# Patient Record
Sex: Female | Born: 1955
Health system: Southern US, Community
[De-identification: ages and names within clinical notes are randomized; demographics above are authoritative.]

## PROBLEM LIST (undated history)

## (undated) DIAGNOSIS — F419 Anxiety disorder, unspecified: Secondary | ICD-10-CM

## (undated) DIAGNOSIS — K5909 Other constipation: Secondary | ICD-10-CM

## (undated) DIAGNOSIS — F329 Major depressive disorder, single episode, unspecified: Secondary | ICD-10-CM

## (undated) DIAGNOSIS — K589 Irritable bowel syndrome without diarrhea: Secondary | ICD-10-CM

## (undated) DIAGNOSIS — I10 Essential (primary) hypertension: Secondary | ICD-10-CM

## (undated) DIAGNOSIS — M1990 Unspecified osteoarthritis, unspecified site: Secondary | ICD-10-CM

## (undated) DIAGNOSIS — R51 Headache: Secondary | ICD-10-CM

## (undated) DIAGNOSIS — M8580 Other specified disorders of bone density and structure, unspecified site: Secondary | ICD-10-CM

## (undated) DIAGNOSIS — F32A Depression, unspecified: Secondary | ICD-10-CM

## (undated) DIAGNOSIS — K219 Gastro-esophageal reflux disease without esophagitis: Secondary | ICD-10-CM

## (undated) DIAGNOSIS — Z87442 Personal history of urinary calculi: Secondary | ICD-10-CM

## (undated) DIAGNOSIS — R519 Headache, unspecified: Secondary | ICD-10-CM

## (undated) DIAGNOSIS — S52501A Unspecified fracture of the lower end of right radius, initial encounter for closed fracture: Secondary | ICD-10-CM

## (undated) HISTORY — DX: Depression, unspecified: F32.A

## (undated) HISTORY — DX: Other specified disorders of bone density and structure, unspecified site: M85.80

## (undated) HISTORY — DX: Unspecified osteoarthritis, unspecified site: M19.90

## (undated) HISTORY — DX: Other constipation: K59.09

## (undated) HISTORY — PX: WRIST FRACTURE SURGERY: SHX121

## (undated) HISTORY — PX: CHOLECYSTECTOMY: SHX55

## (undated) HISTORY — PX: OTHER SURGICAL HISTORY: SHX169

---
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
## 2001-01-31 HISTORY — PX: WRIST FRACTURE SURGERY: SHX121

## 2001-06-03 ENCOUNTER — Other Ambulatory Visit: Admission: RE | Admit: 2001-06-03 | Discharge: 2001-06-03 | Payer: Self-pay | Admitting: Obstetrics and Gynecology

## 2001-10-28 ENCOUNTER — Ambulatory Visit (HOSPITAL_BASED_OUTPATIENT_CLINIC_OR_DEPARTMENT_OTHER): Admission: RE | Admit: 2001-10-28 | Discharge: 2001-10-28 | Payer: Self-pay | Admitting: Urology

## 2001-12-30 ENCOUNTER — Encounter: Payer: Self-pay | Admitting: Internal Medicine

## 2001-12-30 ENCOUNTER — Ambulatory Visit (HOSPITAL_COMMUNITY): Admission: RE | Admit: 2001-12-30 | Discharge: 2001-12-30 | Payer: Self-pay | Admitting: Internal Medicine

## 2002-10-27 ENCOUNTER — Other Ambulatory Visit: Admission: RE | Admit: 2002-10-27 | Discharge: 2002-10-27 | Payer: Self-pay | Admitting: Obstetrics and Gynecology

## 2002-12-22 ENCOUNTER — Ambulatory Visit (HOSPITAL_COMMUNITY): Admission: RE | Admit: 2002-12-22 | Discharge: 2002-12-22 | Payer: Self-pay | Admitting: Cardiology

## 2002-12-22 ENCOUNTER — Encounter: Payer: Self-pay | Admitting: Internal Medicine

## 2003-01-12 ENCOUNTER — Ambulatory Visit (HOSPITAL_BASED_OUTPATIENT_CLINIC_OR_DEPARTMENT_OTHER): Admission: RE | Admit: 2003-01-12 | Discharge: 2003-01-12 | Payer: Self-pay | Admitting: Urology

## 2003-11-16 ENCOUNTER — Other Ambulatory Visit: Admission: RE | Admit: 2003-11-16 | Discharge: 2003-11-16 | Payer: Self-pay | Admitting: Obstetrics and Gynecology

## 2004-11-21 ENCOUNTER — Other Ambulatory Visit: Admission: RE | Admit: 2004-11-21 | Discharge: 2004-11-21 | Payer: Self-pay | Admitting: Obstetrics and Gynecology

## 2011-11-12 ENCOUNTER — Encounter: Payer: Self-pay | Admitting: Internal Medicine

## 2012-11-09 ENCOUNTER — Encounter: Payer: Self-pay | Admitting: Internal Medicine

## 2012-11-10 ENCOUNTER — Encounter: Payer: Self-pay | Admitting: Internal Medicine

## 2015-05-03 ENCOUNTER — Ambulatory Visit (INDEPENDENT_AMBULATORY_CARE_PROVIDER_SITE_OTHER): Payer: BLUE CROSS/BLUE SHIELD | Admitting: *Deleted

## 2015-05-03 DIAGNOSIS — J3089 Other allergic rhinitis: Secondary | ICD-10-CM

## 2015-05-10 ENCOUNTER — Ambulatory Visit (INDEPENDENT_AMBULATORY_CARE_PROVIDER_SITE_OTHER): Payer: BLUE CROSS/BLUE SHIELD | Admitting: *Deleted

## 2015-05-10 DIAGNOSIS — J309 Allergic rhinitis, unspecified: Secondary | ICD-10-CM

## 2015-05-24 ENCOUNTER — Ambulatory Visit (INDEPENDENT_AMBULATORY_CARE_PROVIDER_SITE_OTHER): Payer: BLUE CROSS/BLUE SHIELD | Admitting: *Deleted

## 2015-05-24 DIAGNOSIS — J309 Allergic rhinitis, unspecified: Secondary | ICD-10-CM | POA: Diagnosis not present

## 2015-06-06 DIAGNOSIS — J3089 Other allergic rhinitis: Secondary | ICD-10-CM | POA: Diagnosis not present

## 2015-06-07 ENCOUNTER — Ambulatory Visit (INDEPENDENT_AMBULATORY_CARE_PROVIDER_SITE_OTHER): Payer: BLUE CROSS/BLUE SHIELD | Admitting: *Deleted

## 2015-06-07 DIAGNOSIS — J309 Allergic rhinitis, unspecified: Secondary | ICD-10-CM | POA: Diagnosis not present

## 2015-06-07 DIAGNOSIS — J302 Other seasonal allergic rhinitis: Secondary | ICD-10-CM | POA: Diagnosis not present

## 2015-06-21 ENCOUNTER — Ambulatory Visit (INDEPENDENT_AMBULATORY_CARE_PROVIDER_SITE_OTHER): Payer: BLUE CROSS/BLUE SHIELD | Admitting: *Deleted

## 2015-06-21 DIAGNOSIS — J309 Allergic rhinitis, unspecified: Secondary | ICD-10-CM | POA: Diagnosis not present

## 2015-07-05 ENCOUNTER — Ambulatory Visit (INDEPENDENT_AMBULATORY_CARE_PROVIDER_SITE_OTHER): Payer: BLUE CROSS/BLUE SHIELD | Admitting: *Deleted

## 2015-07-05 DIAGNOSIS — J309 Allergic rhinitis, unspecified: Secondary | ICD-10-CM | POA: Diagnosis not present

## 2015-07-19 ENCOUNTER — Ambulatory Visit (INDEPENDENT_AMBULATORY_CARE_PROVIDER_SITE_OTHER): Payer: BLUE CROSS/BLUE SHIELD | Admitting: *Deleted

## 2015-07-19 DIAGNOSIS — J309 Allergic rhinitis, unspecified: Secondary | ICD-10-CM | POA: Diagnosis not present

## 2015-08-01 ENCOUNTER — Ambulatory Visit (INDEPENDENT_AMBULATORY_CARE_PROVIDER_SITE_OTHER): Payer: BLUE CROSS/BLUE SHIELD | Admitting: *Deleted

## 2015-08-01 DIAGNOSIS — J309 Allergic rhinitis, unspecified: Secondary | ICD-10-CM | POA: Diagnosis not present

## 2015-08-16 ENCOUNTER — Ambulatory Visit (INDEPENDENT_AMBULATORY_CARE_PROVIDER_SITE_OTHER): Payer: PRIVATE HEALTH INSURANCE | Admitting: *Deleted

## 2015-08-16 DIAGNOSIS — J309 Allergic rhinitis, unspecified: Secondary | ICD-10-CM | POA: Diagnosis not present

## 2015-08-30 ENCOUNTER — Ambulatory Visit (INDEPENDENT_AMBULATORY_CARE_PROVIDER_SITE_OTHER): Payer: PRIVATE HEALTH INSURANCE | Admitting: *Deleted

## 2015-08-30 DIAGNOSIS — J309 Allergic rhinitis, unspecified: Secondary | ICD-10-CM

## 2015-09-13 ENCOUNTER — Ambulatory Visit (INDEPENDENT_AMBULATORY_CARE_PROVIDER_SITE_OTHER): Payer: PRIVATE HEALTH INSURANCE

## 2015-09-13 DIAGNOSIS — J309 Allergic rhinitis, unspecified: Secondary | ICD-10-CM

## 2015-10-04 ENCOUNTER — Ambulatory Visit (INDEPENDENT_AMBULATORY_CARE_PROVIDER_SITE_OTHER): Payer: PRIVATE HEALTH INSURANCE | Admitting: *Deleted

## 2015-10-04 DIAGNOSIS — J309 Allergic rhinitis, unspecified: Secondary | ICD-10-CM

## 2015-10-11 ENCOUNTER — Ambulatory Visit (INDEPENDENT_AMBULATORY_CARE_PROVIDER_SITE_OTHER): Payer: PRIVATE HEALTH INSURANCE | Admitting: *Deleted

## 2015-10-11 DIAGNOSIS — J309 Allergic rhinitis, unspecified: Secondary | ICD-10-CM | POA: Diagnosis not present

## 2015-10-11 DIAGNOSIS — J3089 Other allergic rhinitis: Secondary | ICD-10-CM

## 2015-10-25 ENCOUNTER — Ambulatory Visit (INDEPENDENT_AMBULATORY_CARE_PROVIDER_SITE_OTHER): Payer: PRIVATE HEALTH INSURANCE | Admitting: *Deleted

## 2015-10-25 DIAGNOSIS — J309 Allergic rhinitis, unspecified: Secondary | ICD-10-CM

## 2015-11-18 ENCOUNTER — Ambulatory Visit (INDEPENDENT_AMBULATORY_CARE_PROVIDER_SITE_OTHER): Payer: PRIVATE HEALTH INSURANCE | Admitting: *Deleted

## 2015-11-18 DIAGNOSIS — J309 Allergic rhinitis, unspecified: Secondary | ICD-10-CM | POA: Diagnosis not present

## 2015-11-19 DIAGNOSIS — J3089 Other allergic rhinitis: Secondary | ICD-10-CM | POA: Diagnosis not present

## 2015-11-20 DIAGNOSIS — J302 Other seasonal allergic rhinitis: Secondary | ICD-10-CM | POA: Diagnosis not present

## 2015-11-29 ENCOUNTER — Ambulatory Visit (INDEPENDENT_AMBULATORY_CARE_PROVIDER_SITE_OTHER): Payer: PRIVATE HEALTH INSURANCE | Admitting: *Deleted

## 2015-11-29 DIAGNOSIS — J309 Allergic rhinitis, unspecified: Secondary | ICD-10-CM

## 2015-12-13 ENCOUNTER — Ambulatory Visit (INDEPENDENT_AMBULATORY_CARE_PROVIDER_SITE_OTHER): Payer: PRIVATE HEALTH INSURANCE

## 2015-12-13 DIAGNOSIS — J309 Allergic rhinitis, unspecified: Secondary | ICD-10-CM | POA: Diagnosis not present

## 2015-12-27 ENCOUNTER — Ambulatory Visit (INDEPENDENT_AMBULATORY_CARE_PROVIDER_SITE_OTHER): Payer: PRIVATE HEALTH INSURANCE | Admitting: *Deleted

## 2015-12-27 DIAGNOSIS — J309 Allergic rhinitis, unspecified: Secondary | ICD-10-CM | POA: Diagnosis not present

## 2016-08-22 NOTE — Addendum Note (Signed)
Addended by: Felipa Emory on: 08/22/2016 08:27 AM   Modules accepted: Orders

## 2018-03-17 ENCOUNTER — Encounter: Payer: Self-pay | Admitting: Gastroenterology

## 2018-04-08 ENCOUNTER — Other Ambulatory Visit: Payer: Self-pay | Admitting: Orthopedic Surgery

## 2018-04-12 ENCOUNTER — Other Ambulatory Visit: Payer: Self-pay

## 2018-04-12 ENCOUNTER — Encounter (HOSPITAL_BASED_OUTPATIENT_CLINIC_OR_DEPARTMENT_OTHER): Payer: Self-pay | Admitting: *Deleted

## 2018-04-14 ENCOUNTER — Encounter (HOSPITAL_BASED_OUTPATIENT_CLINIC_OR_DEPARTMENT_OTHER): Payer: Self-pay | Admitting: Anesthesiology

## 2018-04-14 ENCOUNTER — Ambulatory Visit (HOSPITAL_BASED_OUTPATIENT_CLINIC_OR_DEPARTMENT_OTHER): Payer: BLUE CROSS/BLUE SHIELD | Admitting: Anesthesiology

## 2018-04-14 ENCOUNTER — Ambulatory Visit (HOSPITAL_BASED_OUTPATIENT_CLINIC_OR_DEPARTMENT_OTHER)
Admission: RE | Admit: 2018-04-14 | Discharge: 2018-04-14 | Disposition: A | Discharge status: Home / Self Care | Payer: BLUE CROSS/BLUE SHIELD | Source: Ambulatory Visit | Attending: Orthopedic Surgery | Admitting: Orthopedic Surgery

## 2018-04-14 ENCOUNTER — Encounter (HOSPITAL_BASED_OUTPATIENT_CLINIC_OR_DEPARTMENT_OTHER)
Admission: RE | Disposition: A | Discharge status: Home / Self Care | Payer: Self-pay | Source: Ambulatory Visit | Attending: Orthopedic Surgery

## 2018-04-14 ENCOUNTER — Other Ambulatory Visit: Payer: Self-pay

## 2018-04-14 DIAGNOSIS — K219 Gastro-esophageal reflux disease without esophagitis: Secondary | ICD-10-CM | POA: Diagnosis not present

## 2018-04-14 DIAGNOSIS — S52201A Unspecified fracture of shaft of right ulna, initial encounter for closed fracture: Secondary | ICD-10-CM | POA: Diagnosis not present

## 2018-04-14 DIAGNOSIS — W010XXA Fall on same level from slipping, tripping and stumbling without subsequent striking against object, initial encounter: Secondary | ICD-10-CM | POA: Diagnosis not present

## 2018-04-14 DIAGNOSIS — F419 Anxiety disorder, unspecified: Secondary | ICD-10-CM | POA: Insufficient documentation

## 2018-04-14 DIAGNOSIS — S52551A Other extraarticular fracture of lower end of right radius, initial encounter for closed fracture: Secondary | ICD-10-CM | POA: Diagnosis not present

## 2018-04-14 DIAGNOSIS — K589 Irritable bowel syndrome without diarrhea: Secondary | ICD-10-CM | POA: Insufficient documentation

## 2018-04-14 HISTORY — DX: Irritable bowel syndrome without diarrhea: K58.9

## 2018-04-14 HISTORY — DX: Anxiety disorder, unspecified: F41.9

## 2018-04-14 HISTORY — DX: Gastro-esophageal reflux disease without esophagitis: K21.9

## 2018-04-14 HISTORY — DX: Headache, unspecified: R51.9

## 2018-04-14 HISTORY — PX: OPEN REDUCTION INTERNAL FIXATION (ORIF) DISTAL RADIAL FRACTURE: SHX5989

## 2018-04-14 HISTORY — DX: Headache: R51

## 2018-04-14 HISTORY — DX: Unspecified fracture of the lower end of right radius, initial encounter for closed fracture: S52.501A

## 2018-04-14 SURGERY — OPEN REDUCTION INTERNAL FIXATION (ORIF) DISTAL RADIUS FRACTURE
Anesthesia: Monitor Anesthesia Care | Laterality: Right

## 2018-04-14 MED ORDER — CEFAZOLIN SODIUM-DEXTROSE 2-3 GM-%(50ML) IV SOLR
INTRAVENOUS | Status: DC | PRN
  Administered 2018-04-14: 2 g via INTRAVENOUS

## 2018-04-14 MED ORDER — MIDAZOLAM HCL 2 MG/2ML IJ SOLN
1.0000 mg | INTRAMUSCULAR | Status: DC | PRN
  Administered 2018-04-14: 2 mg via INTRAVENOUS

## 2018-04-14 MED ORDER — FENTANYL CITRATE (PF) 100 MCG/2ML IJ SOLN
INTRAMUSCULAR | Status: AC
  Filled 2018-04-14: qty 2

## 2018-04-14 MED ORDER — ONDANSETRON HCL 4 MG/2ML IJ SOLN: 4.0000 mg | Freq: Once | INTRAMUSCULAR | Status: DC | PRN

## 2018-04-14 MED ORDER — SCOPOLAMINE 1 MG/3DAYS TD PT72: 1.0000 | MEDICATED_PATCH | Freq: Once | TRANSDERMAL | Status: DC | PRN

## 2018-04-14 MED ORDER — PROPOFOL 500 MG/50ML IV EMUL
INTRAVENOUS | Status: DC | PRN
  Administered 2018-04-14: 75 ug/kg/min via INTRAVENOUS

## 2018-04-14 MED ORDER — CEFAZOLIN SODIUM-DEXTROSE 2-4 GM/100ML-% IV SOLN: 2.0000 g | INTRAVENOUS | Status: DC

## 2018-04-14 MED ORDER — PROPOFOL 10 MG/ML IV BOLUS
INTRAVENOUS | Status: DC | PRN
  Administered 2018-04-14: 10 mg via INTRAVENOUS
  Administered 2018-04-14: 20 mg via INTRAVENOUS

## 2018-04-14 MED ORDER — CEFAZOLIN SODIUM-DEXTROSE 2-4 GM/100ML-% IV SOLN
INTRAVENOUS | Status: AC
  Filled 2018-04-14: qty 100

## 2018-04-14 MED ORDER — LACTATED RINGERS IV SOLN
INTRAVENOUS | Status: DC
  Administered 2018-04-14: 11:00:00 via INTRAVENOUS

## 2018-04-14 MED ORDER — LIDOCAINE 2% (20 MG/ML) 5 ML SYRINGE
INTRAMUSCULAR | Status: AC
  Filled 2018-04-14: qty 5

## 2018-04-14 MED ORDER — OXYCODONE-ACETAMINOPHEN 5-325 MG PO TABS: ORAL_TABLET | ORAL | 0 refills | Status: DC

## 2018-04-14 MED ORDER — MIDAZOLAM HCL 2 MG/2ML IJ SOLN
INTRAMUSCULAR | Status: AC
  Filled 2018-04-14: qty 2

## 2018-04-14 MED ORDER — PROPOFOL 500 MG/50ML IV EMUL: INTRAVENOUS | Status: DC | PRN

## 2018-04-14 MED ORDER — HYDROCODONE-ACETAMINOPHEN 7.5-325 MG PO TABS: 1.0000 | ORAL_TABLET | Freq: Once | ORAL | Status: DC | PRN

## 2018-04-14 MED ORDER — CLONIDINE HCL (ANALGESIA) 100 MCG/ML EP SOLN
EPIDURAL | Status: DC | PRN
  Administered 2018-04-14: 50 ug

## 2018-04-14 MED ORDER — DEXAMETHASONE SODIUM PHOSPHATE 10 MG/ML IJ SOLN
INTRAMUSCULAR | Status: AC
  Filled 2018-04-14: qty 1

## 2018-04-14 MED ORDER — CHLORHEXIDINE GLUCONATE 4 % EX LIQD: 60.0000 mL | Freq: Once | CUTANEOUS | Status: DC

## 2018-04-14 MED ORDER — FENTANYL CITRATE (PF) 100 MCG/2ML IJ SOLN: 25.0000 ug | INTRAMUSCULAR | Status: DC | PRN

## 2018-04-14 MED ORDER — MEPERIDINE HCL 25 MG/ML IJ SOLN: 6.2500 mg | INTRAMUSCULAR | Status: DC | PRN

## 2018-04-14 MED ORDER — BUPIVACAINE-EPINEPHRINE (PF) 0.5% -1:200000 IJ SOLN
INTRAMUSCULAR | Status: DC | PRN
  Administered 2018-04-14: 30 mL via PERINEURAL

## 2018-04-14 MED ORDER — ONDANSETRON HCL 4 MG/2ML IJ SOLN
INTRAMUSCULAR | Status: AC
  Filled 2018-04-14: qty 2

## 2018-04-14 MED ORDER — ONDANSETRON HCL 4 MG/2ML IJ SOLN
INTRAMUSCULAR | Status: DC | PRN
  Administered 2018-04-14: 4 mg via INTRAVENOUS

## 2018-04-14 MED ORDER — FENTANYL CITRATE (PF) 100 MCG/2ML IJ SOLN
50.0000 ug | INTRAMUSCULAR | Status: DC | PRN
  Administered 2018-04-14: 50 ug via INTRAVENOUS

## 2018-04-14 SURGICAL SUPPLY — 57 items
BANDAGE ACE 3X5.8 VEL STRL LF (GAUZE/BANDAGES/DRESSINGS) ×3 IMPLANT
BANDAGE ACE 4X5 VEL STRL LF (GAUZE/BANDAGES/DRESSINGS) ×3 IMPLANT
BIT DRILL 2.0 LNG QUCK RELEASE (BIT) ×1 IMPLANT
BIT DRILL 2.8X5 QR DISP (BIT) ×3 IMPLANT
BLADE SURG 15 STRL LF DISP TIS (BLADE) ×2 IMPLANT
BLADE SURG 15 STRL SS (BLADE) ×4
BNDG ESMARK 4X9 LF (GAUZE/BANDAGES/DRESSINGS) ×3 IMPLANT
BNDG GAUZE ELAST 4 BULKY (GAUZE/BANDAGES/DRESSINGS) ×3 IMPLANT
BNDG PLASTER X FAST 3X3 WHT LF (CAST SUPPLIES) ×45 IMPLANT
CHLORAPREP W/TINT 26ML (MISCELLANEOUS) ×3 IMPLANT
CORD BIPOLAR FORCEPS 12FT (ELECTRODE) ×3 IMPLANT
COVER BACK TABLE 60X90IN (DRAPES) ×3 IMPLANT
COVER MAYO STAND STRL (DRAPES) ×3 IMPLANT
CUFF TOURNIQUET SINGLE 18IN (TOURNIQUET CUFF) ×3 IMPLANT
CUFF TOURNIQUET SINGLE 24IN (TOURNIQUET CUFF) IMPLANT
DRAPE EXTREMITY T 121X128X90 (DRAPE) ×3 IMPLANT
DRAPE OEC MINIVIEW 54X84 (DRAPES) ×3 IMPLANT
DRAPE SURG 17X23 STRL (DRAPES) ×3 IMPLANT
DRILL 2.0 LNG QUICK RELEASE (BIT) ×3
GAUZE SPONGE 4X4 12PLY STRL (GAUZE/BANDAGES/DRESSINGS) ×3 IMPLANT
GAUZE XEROFORM 1X8 LF (GAUZE/BANDAGES/DRESSINGS) ×3 IMPLANT
GLOVE BIO SURGEON STRL SZ7.5 (GLOVE) ×3 IMPLANT
GLOVE BIOGEL PI IND STRL 8 (GLOVE) ×1 IMPLANT
GLOVE BIOGEL PI IND STRL 8.5 (GLOVE) ×1 IMPLANT
GLOVE BIOGEL PI INDICATOR 8 (GLOVE) ×2
GLOVE BIOGEL PI INDICATOR 8.5 (GLOVE) ×2
GLOVE SURG ORTHO 8.0 STRL STRW (GLOVE) IMPLANT
GOWN STRL REUS W/ TWL LRG LVL3 (GOWN DISPOSABLE) ×1 IMPLANT
GOWN STRL REUS W/TWL LRG LVL3 (GOWN DISPOSABLE) ×2
GOWN STRL REUS W/TWL XL LVL3 (GOWN DISPOSABLE) ×3 IMPLANT
GUIDEWIRE ORTHO 0.054X6 (WIRE) ×9 IMPLANT
NEEDLE HYPO 25X1 1.5 SAFETY (NEEDLE) IMPLANT
NS IRRIG 1000ML POUR BTL (IV SOLUTION) ×3 IMPLANT
PACK BASIN DAY SURGERY FS (CUSTOM PROCEDURE TRAY) ×3 IMPLANT
PAD CAST 3X4 CTTN HI CHSV (CAST SUPPLIES) ×1 IMPLANT
PAD CAST 4YDX4 CTTN HI CHSV (CAST SUPPLIES) ×1 IMPLANT
PADDING CAST COTTON 3X4 STRL (CAST SUPPLIES) ×2
PADDING CAST COTTON 4X4 STRL (CAST SUPPLIES) ×2
PLATE R NARROW PROC VDR (Plate) ×3 IMPLANT
SCREW BN FT 16X2.3XLCK HEX CRT (Screw) ×2 IMPLANT
SCREW CORTICAL LOCKING 2.3X16M (Screw) ×8 IMPLANT
SCREW CORTICAL LOCKING 2.3X18M (Screw) ×4 IMPLANT
SCREW FX16X2.3XLCK SMTH NS CRT (Screw) ×2 IMPLANT
SCREW FX18X2.3XSMTH LCK NS CRT (Screw) ×2 IMPLANT
SCREW HEXALOBE NON-LOCK 3.5X14 (Screw) ×3 IMPLANT
SCREW NONLOCK HEX 3.5X12 (Screw) ×6 IMPLANT
SLEEVE SCD COMPRESS KNEE MED (MISCELLANEOUS) ×3 IMPLANT
SPLINT PLASTER CAST XFAST 3X15 (CAST SUPPLIES) ×1 IMPLANT
SPLINT PLASTER XTRA FASTSET 3X (CAST SUPPLIES) ×2
STOCKINETTE 4X48 STRL (DRAPES) ×3 IMPLANT
SUT ETHILON 4 0 PS 2 18 (SUTURE) ×3 IMPLANT
SUT VICRYL 4-0 PS2 18IN ABS (SUTURE) IMPLANT
SYR BULB 3OZ (MISCELLANEOUS) ×3 IMPLANT
SYR CONTROL 10ML LL (SYRINGE) IMPLANT
TOWEL GREEN STERILE FF (TOWEL DISPOSABLE) ×6 IMPLANT
TOWEL OR NON WOVEN STRL DISP B (DISPOSABLE) ×3 IMPLANT
UNDERPAD 30X30 (UNDERPADS AND DIAPERS) ×3 IMPLANT

## 2018-04-14 NOTE — Progress Notes (Signed)
Assisted Dr. Royce Macadamia with right, ultrasound guided, axillary block. Side rails up, monitors on throughout procedure. See vital signs in flow sheet. Tolerated Procedure well.

## 2018-04-14 NOTE — H&P (Signed)
  Felicia Ayala is an 62 y.o. female.   Chief Complaint: right wrist fracture HPI: 62 yo female states she slipped in mud 11 days ago injuring right wrist.  Seen in ED where XR revealed distal radius fracture.  Splinted and followed up in office.  She wishes to have operative fixation.  Allergies: No Known Allergies  Past Medical History:  Diagnosis Date  . Anxiety   . Closed fracture of right distal radius   . GERD (gastroesophageal reflux disease)   . Headache   . IBS (irritable bowel syndrome)     Past Surgical History:  Procedure Laterality Date  . CHOLECYSTECTOMY    . WRIST FRACTURE SURGERY Left     Family History: History reviewed. No pertinent family history.  Social History:   reports that she has never smoked. She has never used smokeless tobacco. She reports that she does not drink alcohol or use drugs.  Medications: No medications prior to admission.    No results found for this or any previous visit (from the past 48 hour(s)).  No results found.   A comprehensive review of systems was negative.  Height 5' 5.5" (1.664 m), weight 68 kg.  General appearance: alert, cooperative and appears stated age Head: Normocephalic, without obvious abnormality, atraumatic Neck: supple, symmetrical, trachea midline Cardio: regular rate and rhythm Resp: clear to auscultation bilaterally Extremities: Intact sensation and capillary refill all digits.  +epl/fpl/io.  No wounds.  Pulses: 2+ and symmetric Skin: Skin color, texture, turgor normal. No rashes or lesions Neurologic: Grossly normal Incision/Wound: none  Assessment/Plan Right distal radius and ulna fractures.  Non operative and operative treatment options were discussed with the patient and patient wishes to proceed with operative treatment. Risks, benefits, and alternatives of surgery were discussed and the patient agrees with the plan of care.   Felicia Ayala R 04/14/2018, 9:12 AM

## 2018-04-14 NOTE — Transfer of Care (Signed)
Immediate Anesthesia Transfer of Care Note  Patient: Felicia Ayala  Procedure(s) Performed: OPEN REDUCTION INTERNAL FIXATION (ORIF) RIGHT DISTAL RADIUS (Right )  Patient Location: PACU  Anesthesia Type:MAC combined with regional for post-op pain  Level of Consciousness: awake, sedated and drowsy  Airway & Oxygen Therapy: Patient Spontanous Breathing and Patient connected to face mask oxygen  Post-op Assessment: Report given to RN and Post -op Vital signs reviewed and stable  Post vital signs: Reviewed and stable  Last Vitals:  Vitals Value Taken Time  BP    Temp    Pulse 74 04/14/2018 12:45 PM  Resp 12 04/14/2018 12:45 PM  SpO2 98 % 04/14/2018 12:45 PM  Vitals shown include unvalidated device data.  Last Pain:  Vitals:   04/14/18 1004  TempSrc: Oral  PainSc: 0-No pain         Complications: No apparent anesthesia complications

## 2018-04-14 NOTE — Anesthesia Preprocedure Evaluation (Addendum)
Anesthesia Evaluation  Patient identified by MRN, date of birth, ID band Patient awake    Reviewed: Allergy & Precautions, NPO status , Patient's Chart, lab work & pertinent test results  Airway Mallampati: II  TM Distance: >3 FB Neck ROM: Full    Dental no notable dental hx. (+) Teeth Intact, Caps   Pulmonary neg pulmonary ROS,    Pulmonary exam normal breath sounds clear to auscultation       Cardiovascular negative cardio ROS Normal cardiovascular exam Rhythm:Regular Rate:Normal     Neuro/Psych  Headaches, Anxiety    GI/Hepatic Neg liver ROS, GERD  Medicated and Controlled,IBS   Endo/Other  negative endocrine ROS  Renal/GU negative Renal ROS  negative genitourinary   Musculoskeletal Fx Right Distal Radius and ulna   Abdominal (+) + obese,   Peds  Hematology negative hematology ROS (+)   Anesthesia Other Findings   Reproductive/Obstetrics                            Anesthesia Physical Anesthesia Plan  ASA: II  Anesthesia Plan: Regional and MAC   Post-op Pain Management:    Induction: Intravenous  PONV Risk Score and Plan: 4 or greater and Midazolam, Dexamethasone, Ondansetron, Propofol infusion and Treatment may vary due to age or medical condition  Airway Management Planned: Natural Airway and Simple Face Mask  Additional Equipment:   Intra-op Plan:   Post-operative Plan:   Informed Consent: I have reviewed the patients History and Physical, chart, labs and discussed the procedure including the risks, benefits and alternatives for the proposed anesthesia with the patient or authorized representative who has indicated his/her understanding and acceptance.   Dental advisory given  Plan Discussed with: CRNA and Surgeon  Anesthesia Plan Comments:        Anesthesia Quick Evaluation

## 2018-04-14 NOTE — Anesthesia Procedure Notes (Signed)
Anesthesia Regional Block: Axillary brachial plexus block   Pre-Anesthetic Checklist: ,, timeout performed, Correct Patient, Correct Site, Correct Laterality, Correct Procedure, Correct Position, site marked, Risks and benefits discussed,  Surgical consent,  Pre-op evaluation,  At surgeon's request and post-op pain management  Laterality: Right  Prep: chloraprep       Needles:  Injection technique: Single-shot  Needle Type: Echogenic Stimulator Needle     Needle Length: 9cm  Needle Gauge: 21   Needle insertion depth: 6 cm   Additional Needles:   Procedures:,,,, ultrasound used (permanent image in chart),,,,  Narrative:  Start time: 04/14/2018 10:50 AM End time: 04/14/2018 10:55 AM Injection made incrementally with aspirations every 5 mL.  Performed by: Personally  Anesthesiologist: Josephine Igo, MD  Additional Notes: Timeout performed. Patient sedated. Relevant anatomy ID'd using [place]. Incremental 2-44ml injection of LA with frequent aspiration. Patient tolerated procedure well.        Right Axillary Block

## 2018-04-14 NOTE — Discharge Instructions (Addendum)

## 2018-04-14 NOTE — Anesthesia Postprocedure Evaluation (Signed)
Anesthesia Post Note  Patient: Libbi Matthews  Procedure(s) Performed: OPEN REDUCTION INTERNAL FIXATION (ORIF) RIGHT DISTAL RADIUS (Right )     Patient location during evaluation: PACU Anesthesia Type: Regional and MAC Level of consciousness: awake and alert and oriented Pain management: pain level controlled Vital Signs Assessment: post-procedure vital signs reviewed and stable Respiratory status: spontaneous breathing, nonlabored ventilation and respiratory function stable Cardiovascular status: blood pressure returned to baseline and stable Postop Assessment: no apparent nausea or vomiting Anesthetic complications: no    Last Vitals:  Vitals:   04/14/18 1300 04/14/18 1308  BP: 124/67   Pulse: 70 63  Resp: 16 13  Temp:    SpO2: 96% 95%    Last Pain:  Vitals:   04/14/18 1308  TempSrc:   PainSc: 0-No pain                 Sigismund Cross A.

## 2018-04-14 NOTE — Op Note (Signed)
04/14/2018 [facility]  Operative Note  Pre Op Diagnosis: Right extraarticular distal radius fracture and distal ulna fracture  Post Op Diagnosis: Right extraarticular distal radius fracture and distal ulna shaft fracture  Procedure:  1. ORIF right extraarticular distal radius fracture 2. Closed treatment distal ulna shaft fracture  Surgeon: Leanora Cover, MD  Assistant: Daryll Brod, MD  Anesthesia: General with regional  Fluids: Per anesthesia flow sheet  EBL: minimal  Complications: None  Specimen: None  Tourniquet Time:  Total Tourniquet Time Documented: Upper Arm (Right) - 40 minutes Total: Upper Arm (Right) - 40 minutes   Disposition: Stable to PACU  INDICATIONS:  Felicia Ayala is a 62 y.o. female states she slipped on mud one and a half weeks ago injuring right wrist.  Seen in ED where XR revealed distal radius and ulna shaft fractures.  Splinted and followed up in office.  We discussed nonoperative and operative treatment options.  She wished to proceed with operative fixation.  Risks, benefits, and alternatives of surgery were discussed including the risk of blood loss; infection; damage to nerves, vessels, tendons, ligaments, bone; failure of surgery; need for additional surgery; complications with wound healing; continued pain; nonunion; malunion; stiffness.  We also discussed the possible need for bone graft and the benefits and risks including the possibility of disease transmission.  She voiced understanding of these risks and elected to proceed.    OPERATIVE COURSE:  After being identified preoperatively by myself, the patient and I agreed upon the procedure and site of procedure.  Surgical site was marked.  The risks, benefits and alternatives of the surgery were reviewed and she wished to proceed.  Surgical consent had been signed.  She was given IV Ancef as preoperative antibiotic prophylaxis.  She was transferred to the operating room and placed on  the operating room table in supine position with the Right upper extremity on an armboard. Sedation was induced by the anesthesiologist.  A regional block had been performed by anesthesia in preoperative holding.  The Right upper extremity was prepped and draped in normal sterile orthopedic fashion.  A surgical pause was performed between the surgeons, anesthesia and operating room staff, and all were in agreement as to the patient, procedure and site of procedure.  Tourniquet at the proximal aspect of the extremity was inflated to 250 mmHg after exsanguination of the limb with an Esmarch bandage.  Standard volar Mallie Mussel approach was used.  The bipolar electrocautery was used to obtain hemostasis.  The superficial and deep portions of the FCR tendon sheath were incised, and the FCR and FPL were swept ulnarly to protect the palmar cutaneous branch of the median nerve.  The brachioradialis was released at the radial side of the radius.  The pronator quadratus was released and elevated with the periosteal elevator.  The fracture site was identified and cleared of soft tissue interposition and hematoma.  It was reduced under direct visualization.  There did not appear to be intraarticular extension.   An AcuMed volar distal radial locking plate was selected.  It was secured to the bone with the guidepins.  C-arm was used in AP and lateral projections to ensure appropriate reduction and position of the hardware and adjustments made as necessary.  Standard AO drilling and measuring technique was used.  A single screw was placed in the slotted hole in the shaft of the plate.  The distal holes were filled with locking pegs with the exception of the styloid holes, which were filled  with locking screws.  The remaining holes in the shaft of the plate were filled with nonlocking screws.  Good purchase was obtained.  C-arm was used in AP, lateral and oblique projections to ensure appropriate reduction and position of hardware,  which was the case.  There was no intra-articular penetration of hardware.  The distal ulna fracture was in good reduction and did not require further reduction or fixation.  The wound was copiously irrigated with sterile saline.  Pronator quadratus was repaired back over top of the plate using 4-0 Vicryl suture.  Vicryl suture was placed in the subcutaneous tissues in an inverted interrupted fashion and the skin was closed with 4-0 nylon in a horizontal mattress fashion.  There was good pronation and supination of the wrist without crepitance.  The wound was then dressed with sterile Xeroform, 4x4s, and wrapped with a Kerlix bandage.  A volar splint was placed and wrapped with Kerlix and Ace bandage.  Tourniquet was deflated at 40 minutes.  Fingertips were pink with brisk capillary refill after deflation of the tourniquet.  Operative drapes were broken down.  The patient was awoken from anesthesia safely.  She was transferred back to the stretcher and taken to the PACU in stable condition.  I will see her back in the office in one week for postoperative followup.  I will give her a prescription for Percocet 5/325 1-2 tabs PO q6 hours prn pain, dispense # 20.    Tennis Must, MD Electronically signed, 04/14/18

## 2018-04-14 NOTE — Op Note (Signed)
I assisted Surgeon(s) and Role:    * Leanora Cover, MD - Primary    Daryll Brod, MD - Assisting on the Procedure(s): OPEN REDUCTION INTERNAL FIXATION (ORIF) RIGHT DISTAL RADIUS on 04/14/2018.  I provided assistance on this case as follows: setup, approach, debridement, reduction and stabilization of the fracture, fixation, closure and application of the dressings and splint. Electronically signed by: Wynonia Sours, MD Date: 04/14/2018 Time: 12:45 PM

## 2018-04-15 ENCOUNTER — Encounter (HOSPITAL_BASED_OUTPATIENT_CLINIC_OR_DEPARTMENT_OTHER): Payer: Self-pay | Admitting: Orthopedic Surgery

## 2018-04-22 ENCOUNTER — Ambulatory Visit: Payer: Self-pay | Admitting: Gastroenterology

## 2019-02-14 ENCOUNTER — Encounter: Payer: Self-pay | Admitting: Gastroenterology

## 2019-02-17 ENCOUNTER — Encounter: Payer: Self-pay | Admitting: Gastroenterology

## 2019-02-17 ENCOUNTER — Telehealth (INDEPENDENT_AMBULATORY_CARE_PROVIDER_SITE_OTHER): Payer: Self-pay | Admitting: Gastroenterology

## 2019-02-17 ENCOUNTER — Other Ambulatory Visit: Payer: Self-pay

## 2019-02-17 VITALS — Ht 65.0 in | Wt 152.0 lb

## 2019-02-17 DIAGNOSIS — R140 Abdominal distension (gaseous): Secondary | ICD-10-CM

## 2019-02-17 DIAGNOSIS — R1310 Dysphagia, unspecified: Secondary | ICD-10-CM

## 2019-02-17 DIAGNOSIS — K581 Irritable bowel syndrome with constipation: Secondary | ICD-10-CM

## 2019-02-17 DIAGNOSIS — R1319 Other dysphagia: Secondary | ICD-10-CM

## 2019-02-17 DIAGNOSIS — Z1211 Encounter for screening for malignant neoplasm of colon: Secondary | ICD-10-CM

## 2019-02-17 MED ORDER — SUPREP BOWEL PREP KIT 17.5-3.13-1.6 GM/177ML PO SOLN: 1.0000 | ORAL | 0 refills | Status: DC

## 2019-02-17 NOTE — Patient Instructions (Signed)
If you are age 63 or older, your body mass index should be between 23-30. Your Body mass index is 25.29 kg/m. If this is out of the aforementioned range listed, please consider follow up with your Primary Care Provider.  If you are age 80 or younger, your body mass index should be between 19-25. Your Body mass index is 25.29 kg/m. If this is out of the aformentioned range listed, please consider follow up with your Primary Care Provider.   You have been scheduled for a colonoscopy. Please follow written instructions given to you at your visit today.  Please pick up your prep supplies at the pharmacy within the next 1-3 days. If you use inhalers (even only as needed), please bring them with you on the day of your procedure. Your physician has requested that you go to www.[email or web address].com and enter the access code given to you at your visit today. This web site gives a general overview about your procedure. However, you should still follow specific instructions given to you by our office regarding your preparation for the procedure.  You have been scheduled for an endoscopy. Please follow written instructions given to you at your visit today. If you use inhalers (even only as needed), please bring them with you on the day of your procedure. Your physician has requested that you go to www.[email or web address].com and enter the access code given to you at your visit today. This web site gives a general overview about your procedure. However, you should still follow specific instructions given to you by our office regarding your preparation for the procedure.   Continue Linzess  Increase water intake.   I have attached a Pre Procedure Patient Acknowledgement form and a prepaid envelope, please initial and sign form and mail back in envelope.   We have sent the following medications to your pharmacy for you to pick up at your convenience: Suprep  Two days before your procedure: Mix 3 packs (or capfuls) of  Miralax in 48 ounces of clear liquid and drink at 6pm.  Thank you,  Dr. Jackquline Denmark

## 2019-02-17 NOTE — Progress Notes (Signed)
Chief Complaint:   Referring Provider:  Dr Bea Graff      ASSESSMENT AND PLAN;   #1. IBS with constipation and abdo bloating, with element of OIC. Nl CBC, TSH 01/2019  #2.  GERD with esophageal dysphagia.  #3.  Colorectal cancer screening.  Plan: - Proceed with EGD with dil/colon with 2 day prep (miralax with suprep). Discussed risks & benefits. (Risks including rare perforation req laparotomy, bleeding after biopsies/polypectomy req blood transfusion, rare chance of missing neoplasms, risks of anesthesia/sedation). Benefits outweigh the risks. Patient agrees to proceed. All the questions were answered.  - Continue Mg (patient to call us with the dose) - linzess 244mcg PO qd to continue. - Continue omeprazole 20 mg on as-needed basis for now. - If still with problems and the above WU is negative, proceed with CT scan abdo/pelvis. - Increase water intake. - Minimize pain medications as she is already been doing.     HPI:    Felicia Ayala is a 63 y.o. female  -Longstanding history of Contipation x over 20 years, has been getting worse.  Most recently Linzess has been increased to 290 mcg p.o. once a day with some relief.  Has significant associated abdominal bloating, passage of pellet-like stools.  One episode of blood in the stool which is resolved.,  Denies having any weight loss, nocturnal symptoms, skin rash.  Has been on pain medications and has been using it sparingly.  She also would take nonsteroidals which she has stopped recently.  Does drink plenty of water.  Very much concerned about colonic neoplasms.  -Has been having intermittent dysphagia mostly to solids.  Food getting hung in the upper neck and mid chest.  Mostly with solids.  Intermittent, few episodes of self-induced vomiting.  Did very well with previous esophageal dilatation.  Would like to get repeat dilatation.  No fever or chills.   SH - daughter with IBS withdiarrhea  Past GI procedures:  -Colonoscopy ([facility]) 06/2010-redundant colon, mild melanosis coli. -EGD 03/2013 moderate gastritis, S/P dil 50 Fr with good relief. Neg SB bx for celiac. -CT abdo/pelvis without contrast-minimal right-sided hydronephrosis, bilateral nonobstructing renal stones, mild DJD lower spine. Past Medical History:  Diagnosis Date  . Anxiety   . Chronic constipation   . Closed fracture of right distal radius   . Depression   . GERD (gastroesophageal reflux disease)   . Headache   . IBS (irritable bowel syndrome)   . Osteopenia     Past Surgical History:  Procedure Laterality Date  . CHOLECYSTECTOMY    . COLONOSCOPY  06/20/2010   Minimal melanosis. Redundant colon. Otherwise normal colonoscopy.   . ESOPHAGOGASTRODUODENOSCOPY  03/24/2013   Mild gastritis. Otherwise normal EGD.  Marland Kitchen OPEN REDUCTION INTERNAL FIXATION (ORIF) DISTAL RADIAL FRACTURE Right 04/14/2018   Procedure: OPEN REDUCTION INTERNAL FIXATION (ORIF) RIGHT DISTAL RADIUS;  Surgeon: Leanora Cover, MD;  Location: [facility];  Service: Orthopedics;  Laterality: Right;  . WRIST FRACTURE SURGERY Left     No family history on file.  Social History   Tobacco Use  . Smoking status: Never Smoker  . Smokeless tobacco: Never Used  Substance Use Topics  . Alcohol use: Never    Frequency: Never  . Drug use: Never    Current Outpatient Medications  Medication Sig Dispense Refill  . fluticasone (FLONASE) 50 MCG/ACT nasal spray 1 SPRAY IN EACH NOSTRIL TWICE DAILY    . linaclotide (LINZESS) 290 MCG CAPS capsule Take 290 mcg by mouth daily  before breakfast.    . magnesium 30 MG tablet Take 30 mg by mouth 2 (two) times daily.    . Multiple Vitamin (MULTIVITAMIN WITH MINERALS) TABS tablet Take 1 tablet by mouth daily.    Marland Kitchen omeprazole (PRILOSEC) 20 MG capsule Take 20 mg by mouth daily.    Marland Kitchen oxyCODONE-acetaminophen (PERCOCET) 5-325 MG tablet 1-2 tabs po q6 hours prn pain 20 tablet 0  . topiramate (TOPAMAX) 50 MG tablet Take 100 mg by  mouth 2 (two) times daily.    Marland Kitchen venlafaxine XR (EFFEXOR-XR) 75 MG 24 hr capsule Take 75 mg by mouth daily with breakfast.     No current facility-administered medications for this visit.     No Known Allergies  Review of Systems:  Constitutional: Denies fever, chills, diaphoresis, appetite change and fatigue.  HEENT: Denies photophobia, eye pain, redness, hearing loss, ear pain, congestion, sore throat, rhinorrhea, sneezing, mouth sores, neck pain, neck stiffness and tinnitus.   Respiratory: Denies SOB, DOE, cough, chest tightness,  and wheezing.   Cardiovascular: Denies chest pain, palpitations and leg swelling.  Genitourinary: Denies dysuria, urgency, frequency, hematuria, flank pain and difficulty urinating.  Musculoskeletal: Has myalgias, back pain, No joint swelling, arthralgias and gait problem.  Skin: No rash.  Neurological: Denies dizziness, seizures, syncope, weakness, light-headedness, numbness and headaches.  Hematological: Denies adenopathy. Easy bruising, personal or family bleeding history  Psychiatric/Behavioral: Has anxiety or depression     Physical Exam:    Ht 5\' 5"  (1.651 m)   Wt 152 lb (68.9 kg)   BMI 25.29 kg/m  Filed Weights   02/17/19 0836  Weight: 152 lb (68.9 kg)    This service was provided via telemedicine.  Doxy video was tried but failed. The patient was located at home.  The provider was located in office.  The patient did consent to this telephone visit and is aware of possible charges through their insurance for this visit.  The patient was referred by Dr. Bea Graff.    Time spent on call/coordination of care: 30 min    Carmell Austria, MD 02/17/2019, 10:10 AM  Cc: Dr. Bea Graff

## 2019-03-09 ENCOUNTER — Telehealth: Payer: Self-pay | Admitting: Gastroenterology

## 2019-03-09 NOTE — Telephone Encounter (Signed)

## 2019-03-10 ENCOUNTER — Encounter: Payer: Self-pay | Admitting: Gastroenterology

## 2019-03-10 ENCOUNTER — Ambulatory Visit (AMBULATORY_SURGERY_CENTER): Payer: BC Managed Care – PPO | Admitting: Gastroenterology

## 2019-03-10 ENCOUNTER — Other Ambulatory Visit: Payer: Self-pay

## 2019-03-10 VITALS — BP 124/82 | HR 71 | Temp 98.4°F | Resp 20

## 2019-03-10 DIAGNOSIS — K3189 Other diseases of stomach and duodenum: Secondary | ICD-10-CM

## 2019-03-10 DIAGNOSIS — R1319 Other dysphagia: Secondary | ICD-10-CM

## 2019-03-10 DIAGNOSIS — K317 Polyp of stomach and duodenum: Secondary | ICD-10-CM

## 2019-03-10 DIAGNOSIS — Z1211 Encounter for screening for malignant neoplasm of colon: Secondary | ICD-10-CM

## 2019-03-10 DIAGNOSIS — D120 Benign neoplasm of cecum: Secondary | ICD-10-CM | POA: Diagnosis not present

## 2019-03-10 DIAGNOSIS — R1310 Dysphagia, unspecified: Secondary | ICD-10-CM

## 2019-03-10 MED ORDER — SODIUM CHLORIDE 0.9 % IV SOLN: 500.0000 mL | Freq: Once | INTRAVENOUS | Status: DC

## 2019-03-10 NOTE — Progress Notes (Signed)
Pt had an IV in her Rt AC which infiltrated after propfol was administered in the procedure room.  IV was re-started during procedure per CRNA.  Upon IV removal in PACU, first IV site (rt AC) appears reddened and raised.  Pt. States that her bicep area on this side is tender.  Skin cool to touch.  Given warm compress and advised to apply to ease soreness and dissipate solution that infiltrated under her skin.  She agreed to all.

## 2019-03-10 NOTE — Op Note (Signed)
[facility] Patient Name: Felicia Ayala Procedure Date: 03/10/2019 1:00 PM MRN: [id number] Endoscopist: Jackquline Denmark , MD Age: 63 Referring MD:  Date of Birth: 08/09/1955 Gender: Female Account #: [id number] Procedure:                Upper GI endoscopy Indications:              Dysphagia Medicines:                Monitored Anesthesia Care Procedure:                Pre-Anesthesia Assessment:                           - Prior to the procedure, a History and Physical                            was performed, and patient medications and                            allergies were reviewed. The patient's tolerance of                            previous anesthesia was also reviewed. The risks                            and benefits of the procedure and the sedation                            options and risks were discussed with the patient.                            All questions were answered, and informed consent                            was obtained. Prior Anticoagulants: The patient has                            taken no previous anticoagulant or antiplatelet                            agents. ASA Grade Assessment: II - A patient with                            mild systemic disease. After reviewing the risks                            and benefits, the patient was deemed in                            satisfactory condition to undergo the procedure.                           After obtaining informed consent, the endoscope was  passed under direct vision. Throughout the                            procedure, the patient's blood pressure, pulse, and                            oxygen saturations were monitored continuously. The                            Endoscope was introduced through the mouth, and                            advanced to the second part of duodenum. The upper                            GI endoscopy was accomplished without difficulty.                             The patient tolerated the procedure well. Scope In: Scope Out: Findings:                 The esophagus was normal. The Z line was                            well-defined at 35 cm. Also examined by narrowband                            imaging. Dilation was performed with a Maloney                            dilator with mild resistance at 50 Fr. Biopsies                            were obtained from the proximal and distal                            esophagus with cold forceps for histology to r/o                            eosinophilic esophagitis. Estimated blood loss:                            none.                           Localized mild inflammation characterized by                            erythema was found in the gastric antrum. Biopsies                            were taken with a cold forceps for histology.                           5-6  small 6 to 8 mm sessile polyps with no bleeding                            and no stigmata of recent bleeding were found in                            the gastric fundus, in the gastric body and in the                            gastric antrum. 3 polyps were removed with a hot                            snare. Resection and retrieval were complete.                            Estimated blood loss: none.                           The examined duodenum was normal. Complications:            No immediate complications. Estimated Blood Loss:     Estimated blood loss: none. Impression:               -Mild gastritis.                           -Incidental gastric polyps (s/p polypectomy x 3)                           -S/P empiric esophageal dilatation. Recommendation:           - Patient has a contact number available for                            emergencies. The signs and symptoms of potential                            delayed complications were discussed with the                            patient. Return to normal  activities tomorrow.                            Written discharge instructions were provided to the                            patient.                           - Post dilatation diet                           - Continue present medications.                           - Await pathology results.                           -  Return to GI clinic PRN. Jackquline Denmark, MD 03/10/2019 1:57:03 PM This report has been signed electronically.

## 2019-03-10 NOTE — Progress Notes (Signed)
PT taken to PACU. Monitors in place. VSS. Report given to RN. 

## 2019-03-10 NOTE — Progress Notes (Signed)
Called to room to assist during endoscopic procedure.  Patient ID and intended procedure confirmed with present staff. Received instructions for my participation in the procedure from the performing physician.  

## 2019-03-10 NOTE — Op Note (Signed)
[facility] Patient Name: Felicia Ayala Procedure Date: 03/10/2019 1:00 PM MRN: [id number] Endoscopist: Jackquline Denmark , MD Age: 63 Referring MD:  Date of Birth: [birth year] Gender: Female Account #: [id number] Procedure:                Colonoscopy Indications:              Screening for colorectal malignant neoplasm Medicines:                Monitored Anesthesia Care Procedure:                Pre-Anesthesia Assessment:                           - Prior to the procedure, a History and Physical                            was performed, and patient medications and                            allergies were reviewed. The patient's tolerance of                            previous anesthesia was also reviewed. The risks                            and benefits of the procedure and the sedation                            options and risks were discussed with the patient.                            All questions were answered, and informed consent                            was obtained. Prior Anticoagulants: The patient has                            taken no previous anticoagulant or antiplatelet                            agents. ASA Grade Assessment: II - A patient with                            mild systemic disease. After reviewing the risks                            and benefits, the patient was deemed in                            satisfactory condition to undergo the procedure.                           After obtaining informed consent, the colonoscope  was passed under direct vision. Throughout the                            procedure, the patient's blood pressure, pulse, and                            oxygen saturations were monitored continuously. The                            Colonoscope was introduced through the anus and                            advanced to the 2 cm into the ileum. The                            colonoscopy was performed  without difficulty. The                            patient tolerated the procedure well. The quality                            of the bowel preparation was good. The terminal                            ileum, ileocecal valve, appendiceal orifice, and                            rectum were photographed. The colon was somewhat                            redundant. Scope In: 1:36:47 PM Scope Out: 1:49:26 PM Scope Withdrawal Time: 0 hours 7 minutes 19 seconds  Total Procedure Duration: 0 hours 12 minutes 39 seconds  Findings:                 A 4 mm polyp was found in the cecum. The polyp was                            sessile. The polyp was removed with a cold biopsy                            forceps. Resection and retrieval were complete.                           Non-bleeding internal hemorrhoids were found during                            retroflexion. The hemorrhoids were small.                           The terminal ileum appeared normal.                           The exam was otherwise without abnormality on  direct and retroflexion views. Complications:            No immediate complications. Estimated Blood Loss:     Estimated blood loss: none. Impression:               - One 4 mm polyp in the cecum, removed with a cold                            biopsy forceps. Resected and retrieved.                           - Non-bleeding internal hemorrhoids.                           - Otherwise normal colonoscopy to TI. The colon was                            somewhat redundant. Recommendation:           - Patient has a contact number available for                            emergencies. The signs and symptoms of potential                            delayed complications were discussed with the                            patient. Return to normal activities tomorrow.                            Written discharge instructions were provided to the                             patient.                           - Resume previous diet.                           - Continue present medications. Continue MiraLAX 17                            g p.o. once a day.                           - Await pathology results.                           - Repeat colonoscopy for surveillance based on                            pathology results.                           - Return to GI clinic PRN. Jackquline Denmark, MD 03/10/2019 2:02:01 PM This report has been signed electronically.

## 2019-03-10 NOTE — Patient Instructions (Signed)
Handouts given for polyps, hemorrhoids, gastritis, esophagitis, post-dilation diet.  YOU HAD AN ENDOSCOPIC PROCEDURE TODAY AT [facility]:   Refer to the procedure report that was given to you for any specific questions about what was found during the examination.  If the procedure report does not answer your questions, please call your gastroenterologist to clarify.  If you requested that your care partner not be given the details of your procedure findings, then the procedure report has been included in a sealed envelope for you to review at your convenience later.  YOU SHOULD EXPECT: Some feelings of bloating in the abdomen. Passage of more gas than usual.  Walking can help get rid of the air that was put into your GI tract during the procedure and reduce the bloating. If you had a lower endoscopy (such as a colonoscopy or flexible sigmoidoscopy) you may notice spotting of blood in your stool or on the toilet paper. If you underwent a bowel prep for your procedure, you may not have a normal bowel movement for a few days.  Please Note:  You might notice some irritation and congestion in your nose or some drainage.  This is from the oxygen used during your procedure.  There is no need for concern and it should clear up in a day or so.  SYMPTOMS TO REPORT IMMEDIATELY:   Following lower endoscopy (colonoscopy or flexible sigmoidoscopy):  Excessive amounts of blood in the stool  Significant tenderness or worsening of abdominal pains  Swelling of the abdomen that is new, acute  Fever of 100F or higher   Following upper endoscopy (EGD)  Vomiting of blood or coffee ground material  New chest pain or pain under the shoulder blades  Painful or persistently difficult swallowing  New shortness of breath  Fever of 100F or higher  Black, tarry-looking stools  For urgent or emergent issues, a gastroenterologist can be reached at any hour by calling [phone].   DIET:  We do  recommend a small meal at first, but then you may proceed to your regular diet.  Drink plenty of fluids but you should avoid alcoholic beverages for 24 hours.  ACTIVITY:  You should plan to take it easy for the rest of today and you should NOT DRIVE or use heavy machinery until tomorrow (because of the sedation medicines used during the test).    FOLLOW UP: Our staff will call the number listed on your records 48-72 hours following your procedure to check on you and address any questions or concerns that you may have regarding the information given to you following your procedure. If we do not reach you, we will leave a message.  We will attempt to reach you two times.  During this call, we will ask if you have developed any symptoms of COVID 19. If you develop any symptoms (ie: fever, flu-like symptoms, shortness of breath, cough etc.) before then, please call [phone].  If you test positive for Covid 19 in the 2 weeks post procedure, please call and report this information to [place].    If any biopsies were taken you will be contacted by phone or by letter within the next 1-3 weeks.  Please call us at [phone] if you have not heard about the biopsies in 3 weeks.    SIGNATURES/CONFIDENTIALITY: You and/or your care partner have signed paperwork which will be entered into your electronic medical record.  These signatures attest to the fact that that the information above  on your After Visit Summary has been reviewed and is understood.  Full responsibility of the confidentiality of this discharge information lies with you and/or your care-partner.

## 2019-03-14 ENCOUNTER — Telehealth: Payer: Self-pay

## 2019-03-14 NOTE — Telephone Encounter (Signed)
Left message on follow up call. 

## 2019-03-18 ENCOUNTER — Encounter: Payer: Self-pay | Admitting: Gastroenterology

## 2019-09-04 DIAGNOSIS — U071 COVID-19: Secondary | ICD-10-CM

## 2019-09-04 HISTORY — DX: COVID-19: U07.1

## 2021-03-22 ENCOUNTER — Emergency Department (HOSPITAL_COMMUNITY): Payer: BC Managed Care – PPO | Admitting: Certified Registered Nurse Anesthetist

## 2021-03-22 ENCOUNTER — Encounter (HOSPITAL_COMMUNITY): Payer: Self-pay

## 2021-03-22 ENCOUNTER — Emergency Department (HOSPITAL_COMMUNITY): Payer: BC Managed Care – PPO

## 2021-03-22 ENCOUNTER — Observation Stay (HOSPITAL_COMMUNITY)
Admission: EM | Admit: 2021-03-22 | Discharge: 2021-03-23 | Disposition: A | Discharge status: Home / Self Care | Payer: BC Managed Care – PPO | Attending: Urology | Admitting: Urology

## 2021-03-22 ENCOUNTER — Encounter (HOSPITAL_COMMUNITY)
Admission: EM | Disposition: A | Discharge status: Home / Self Care | Payer: Self-pay | Source: Home / Self Care | Attending: Emergency Medicine

## 2021-03-22 ENCOUNTER — Other Ambulatory Visit: Payer: Self-pay

## 2021-03-22 ENCOUNTER — Observation Stay (HOSPITAL_COMMUNITY): Payer: BC Managed Care – PPO

## 2021-03-22 DIAGNOSIS — N200 Calculus of kidney: Secondary | ICD-10-CM

## 2021-03-22 DIAGNOSIS — Z20822 Contact with and (suspected) exposure to covid-19: Secondary | ICD-10-CM | POA: Diagnosis not present

## 2021-03-22 DIAGNOSIS — N132 Hydronephrosis with renal and ureteral calculous obstruction: Principal | ICD-10-CM | POA: Insufficient documentation

## 2021-03-22 DIAGNOSIS — N390 Urinary tract infection, site not specified: Secondary | ICD-10-CM | POA: Insufficient documentation

## 2021-03-22 DIAGNOSIS — N179 Acute kidney failure, unspecified: Secondary | ICD-10-CM | POA: Insufficient documentation

## 2021-03-22 HISTORY — PX: CYSTOSCOPY W/ URETERAL STENT PLACEMENT: SHX1429

## 2021-03-22 LAB — URINALYSIS, ROUTINE W REFLEX MICROSCOPIC
Bilirubin Urine: NEGATIVE
Glucose, UA: NEGATIVE mg/dL
Ketones, ur: NEGATIVE mg/dL
Nitrite: NEGATIVE
Protein, ur: NEGATIVE mg/dL
Specific Gravity, Urine: 1.013 (ref 1.005–1.030)
pH: 7 (ref 5.0–8.0)

## 2021-03-22 LAB — RESP PANEL BY RT-PCR (FLU A&B, COVID) ARPGX2
Influenza A by PCR: NEGATIVE
Influenza B by PCR: NEGATIVE
SARS Coronavirus 2 by RT PCR: NEGATIVE

## 2021-03-22 LAB — CBC WITH DIFFERENTIAL/PLATELET
Abs Immature Granulocytes: 0.01 10*3/uL (ref 0.00–0.07)
Basophils Absolute: 0 10*3/uL (ref 0.0–0.1)
Basophils Relative: 0 %
Eosinophils Absolute: 0.1 10*3/uL (ref 0.0–0.5)
Eosinophils Relative: 2 %
HCT: 41.1 % (ref 36.0–46.0)
Hemoglobin: 13.5 g/dL (ref 12.0–15.0)
Immature Granulocytes: 0 %
Lymphocytes Relative: 23 %
Lymphs Abs: 1.7 10*3/uL (ref 0.7–4.0)
MCH: 30.1 pg (ref 26.0–34.0)
MCHC: 32.8 g/dL (ref 30.0–36.0)
MCV: 91.5 fL (ref 80.0–100.0)
Monocytes Absolute: 0.8 10*3/uL (ref 0.1–1.0)
Monocytes Relative: 11 %
Neutro Abs: 4.9 10*3/uL (ref 1.7–7.7)
Neutrophils Relative %: 64 %
Platelets: 231 10*3/uL (ref 150–400)
RBC: 4.49 MIL/uL (ref 3.87–5.11)
RDW: 12.8 % (ref 11.5–15.5)
WBC: 7.6 10*3/uL (ref 4.0–10.5)
nRBC: 0 % (ref 0.0–0.2)

## 2021-03-22 LAB — COMPREHENSIVE METABOLIC PANEL
ALT: 22 U/L (ref 0–44)
AST: 28 U/L (ref 15–41)
Albumin: 3.9 g/dL (ref 3.5–5.0)
Alkaline Phosphatase: 74 U/L (ref 38–126)
Anion gap: 9 (ref 5–15)
BUN: 26 mg/dL — ABNORMAL HIGH (ref 8–23)
CO2: 24 mmol/L (ref 22–32)
Calcium: 8.9 mg/dL (ref 8.9–10.3)
Chloride: 104 mmol/L (ref 98–111)
Creatinine, Ser: 1.72 mg/dL — ABNORMAL HIGH (ref 0.44–1.00)
GFR, Estimated: 33 mL/min — ABNORMAL LOW (ref >60)
Glucose, Bld: 96 mg/dL (ref 70–99)
Potassium: 4.1 mmol/L (ref 3.5–5.1)
Sodium: 137 mmol/L (ref 135–145)
Total Bilirubin: 0.8 mg/dL (ref 0.3–1.2)
Total Protein: 6.8 g/dL (ref 6.5–8.1)

## 2021-03-22 LAB — LIPASE, BLOOD: Lipase: 30 U/L (ref 11–51)

## 2021-03-22 SURGERY — CYSTOSCOPY, WITH RETROGRADE PYELOGRAM AND URETERAL STENT INSERTION
Anesthesia: General | Laterality: Bilateral

## 2021-03-22 MED ORDER — SODIUM CHLORIDE 0.9 % IV SOLN
2.0000 g | Freq: Once | INTRAVENOUS | Status: AC
  Administered 2021-03-22: 2 g via INTRAVENOUS
  Filled 2021-03-22: qty 20

## 2021-03-22 MED ORDER — IOHEXOL 300 MG/ML  SOLN
INTRAMUSCULAR | Status: DC | PRN
  Administered 2021-03-22: 20 mL

## 2021-03-22 MED ORDER — BUTALBITAL-APAP-CAFFEINE 50-325-40 MG PO CAPS: 1.0000 | ORAL_CAPSULE | ORAL | Status: DC | PRN

## 2021-03-22 MED ORDER — SODIUM CHLORIDE 0.9 % IV SOLN: INTRAVENOUS | Status: DC

## 2021-03-22 MED ORDER — CEFTRIAXONE SODIUM 1 G IJ SOLR
1.0000 g | INTRAMUSCULAR | Status: DC
  Filled 2021-03-22: qty 10

## 2021-03-22 MED ORDER — LIDOCAINE 2% (20 MG/ML) 5 ML SYRINGE
INTRAMUSCULAR | Status: AC
  Filled 2021-03-22: qty 5

## 2021-03-22 MED ORDER — MIDAZOLAM HCL 2 MG/2ML IJ SOLN
INTRAMUSCULAR | Status: DC | PRN
  Administered 2021-03-22 (×2): 1 mg via INTRAVENOUS

## 2021-03-22 MED ORDER — LORATADINE 10 MG PO TABS
10.0000 mg | ORAL_TABLET | Freq: Every day | ORAL | Status: DC
  Administered 2021-03-23: 10 mg via ORAL
  Filled 2021-03-22: qty 1

## 2021-03-22 MED ORDER — FENTANYL CITRATE (PF) 100 MCG/2ML IJ SOLN
INTRAMUSCULAR | Status: AC
  Filled 2021-03-22: qty 2

## 2021-03-22 MED ORDER — PANTOPRAZOLE SODIUM 40 MG PO TBEC
40.0000 mg | DELAYED_RELEASE_TABLET | Freq: Every day | ORAL | Status: DC
  Administered 2021-03-23: 40 mg via ORAL
  Filled 2021-03-22: qty 1

## 2021-03-22 MED ORDER — FENTANYL CITRATE (PF) 100 MCG/2ML IJ SOLN
INTRAMUSCULAR | Status: DC | PRN
  Administered 2021-03-22: 100 ug via INTRAVENOUS

## 2021-03-22 MED ORDER — HYDROMORPHONE HCL 1 MG/ML IJ SOLN
0.5000 mg | Freq: Once | INTRAMUSCULAR | Status: AC
Start: 2021-03-22 — End: 2021-03-22
  Administered 2021-03-22: 0.5 mg via INTRAVENOUS
  Filled 2021-03-22: qty 1

## 2021-03-22 MED ORDER — BUTALBITAL-APAP-CAFFEINE 50-325-40 MG PO TABS: 1.0000 | ORAL_TABLET | ORAL | Status: DC | PRN

## 2021-03-22 MED ORDER — LINACLOTIDE 145 MCG PO CAPS: 290.0000 ug | ORAL_CAPSULE | Freq: Every evening | ORAL | Status: DC

## 2021-03-22 MED ORDER — ONDANSETRON HCL 4 MG/2ML IJ SOLN
INTRAMUSCULAR | Status: DC | PRN
  Administered 2021-03-22: 4 mg via INTRAVENOUS

## 2021-03-22 MED ORDER — PHENYLEPHRINE 40 MCG/ML (10ML) SYRINGE FOR IV PUSH (FOR BLOOD PRESSURE SUPPORT)
PREFILLED_SYRINGE | INTRAVENOUS | Status: DC | PRN
  Administered 2021-03-22: 80 ug via INTRAVENOUS

## 2021-03-22 MED ORDER — DULOXETINE HCL 60 MG PO CPEP
60.0000 mg | ORAL_CAPSULE | Freq: Every day | ORAL | Status: DC
  Administered 2021-03-23: 60 mg via ORAL
  Filled 2021-03-22: qty 1

## 2021-03-22 MED ORDER — DEXAMETHASONE SODIUM PHOSPHATE 10 MG/ML IJ SOLN
INTRAMUSCULAR | Status: DC | PRN
  Administered 2021-03-22: 8 mg via INTRAVENOUS

## 2021-03-22 MED ORDER — HYDROMORPHONE HCL 1 MG/ML IJ SOLN: 0.5000 mg | INTRAMUSCULAR | Status: DC | PRN

## 2021-03-22 MED ORDER — ATORVASTATIN CALCIUM 10 MG PO TABS
10.0000 mg | ORAL_TABLET | Freq: Every day | ORAL | Status: DC
  Administered 2021-03-23: 10 mg via ORAL
  Filled 2021-03-22: qty 1

## 2021-03-22 MED ORDER — PROPOFOL 10 MG/ML IV BOLUS
INTRAVENOUS | Status: DC | PRN
Start: 2021-03-22 — End: 2021-03-22
  Administered 2021-03-22: 110 mg via INTRAVENOUS

## 2021-03-22 MED ORDER — SENNOSIDES-DOCUSATE SODIUM 8.6-50 MG PO TABS
1.0000 | ORAL_TABLET | Freq: Two times a day (BID) | ORAL | Status: DC
  Administered 2021-03-22 – 2021-03-23 (×2): 1 via ORAL
  Filled 2021-03-22 (×3): qty 1

## 2021-03-22 MED ORDER — OXYCODONE HCL 5 MG PO TABS
5.0000 mg | ORAL_TABLET | ORAL | Status: DC | PRN
  Administered 2021-03-22: 5 mg via ORAL
  Filled 2021-03-22: qty 1

## 2021-03-22 MED ORDER — STERILE WATER FOR IRRIGATION IR SOLN
Status: DC | PRN
  Administered 2021-03-22: 3000 mL

## 2021-03-22 MED ORDER — LACTATED RINGERS IV SOLN: INTRAVENOUS | Status: DC | PRN

## 2021-03-22 MED ORDER — LIDOCAINE HCL (CARDIAC) PF 100 MG/5ML IV SOSY
PREFILLED_SYRINGE | INTRAVENOUS | Status: DC | PRN
Start: 2021-03-22 — End: 2021-03-22
  Administered 2021-03-22: 60 mg via INTRAVENOUS

## 2021-03-22 MED ORDER — MIDAZOLAM HCL 2 MG/2ML IJ SOLN
INTRAMUSCULAR | Status: AC
  Filled 2021-03-22: qty 2

## 2021-03-22 MED ORDER — PROPOFOL 10 MG/ML IV BOLUS
INTRAVENOUS | Status: AC
  Filled 2021-03-22: qty 20

## 2021-03-22 MED ORDER — TOPIRAMATE 100 MG PO TABS
100.0000 mg | ORAL_TABLET | Freq: Every day | ORAL | Status: DC
  Administered 2021-03-23: 100 mg via ORAL
  Filled 2021-03-22: qty 1

## 2021-03-22 MED ORDER — ONDANSETRON HCL 4 MG/2ML IJ SOLN
4.0000 mg | Freq: Once | INTRAMUSCULAR | Status: AC
  Administered 2021-03-22: 4 mg via INTRAVENOUS
  Filled 2021-03-22: qty 2

## 2021-03-22 MED ORDER — SUCCINYLCHOLINE CHLORIDE 200 MG/10ML IV SOSY
PREFILLED_SYRINGE | INTRAVENOUS | Status: DC | PRN
  Administered 2021-03-22: 100 mg via INTRAVENOUS

## 2021-03-22 SURGICAL SUPPLY — 14 items
BAG URO CATCHER STRL LF (MISCELLANEOUS) ×2 IMPLANT
BASKET ZERO TIP NITINOL 2.4FR (BASKET) IMPLANT
CATH INTERMIT  6FR 70CM (CATHETERS) ×2 IMPLANT
CLOTH BEACON ORANGE TIMEOUT ST (SAFETY) ×2 IMPLANT
GLOVE SURG ENC TEXT LTX SZ7.5 (GLOVE) ×2 IMPLANT
GOWN STRL REUS W/TWL LRG LVL3 (GOWN DISPOSABLE) ×2 IMPLANT
GUIDEWIRE ANG ZIPWIRE 038X150 (WIRE) ×2 IMPLANT
GUIDEWIRE STR DUAL SENSOR (WIRE) IMPLANT
KIT TURNOVER KIT A (KITS) ×2 IMPLANT
MANIFOLD NEPTUNE II (INSTRUMENTS) ×2 IMPLANT
PACK CYSTO (CUSTOM PROCEDURE TRAY) ×2 IMPLANT
STENT POLARIS 5FRX24 (STENTS) ×4 IMPLANT
TUBING CONNECTING 10 (TUBING) ×2 IMPLANT
TUBING UROLOGY SET (TUBING) IMPLANT

## 2021-03-22 NOTE — ED Provider Notes (Signed)
[facility] DEPT Provider Note   CSN: [id number] Arrival date & time: 03/22/21  1344     History No chief complaint on file.   Felicia Ayala is a 65 y.o. female.  Patient complains of right flank pain.  Patient was diagnosed with a kidney stone a few days ago and has developed a fever today.  The history is provided by the patient and medical records. No language interpreter was used.  Flank Pain This is a new problem. The current episode started more than 2 days ago. The problem occurs constantly. The problem has not changed since onset.Pertinent negatives include no chest pain, no abdominal pain and no headaches. Nothing aggravates the symptoms. Nothing relieves the symptoms. She has tried nothing for the symptoms. The treatment provided no relief.      Past Medical History:  Diagnosis Date   Anxiety    Arthritis    Chronic constipation    Closed fracture of right distal radius    Depression    GERD (gastroesophageal reflux disease)    Headache    IBS (irritable bowel syndrome)    Osteopenia     There are no problems to display for this patient.   Past Surgical History:  Procedure Laterality Date   CHOLECYSTECTOMY     COLONOSCOPY  06/20/2010   Minimal melanosis. Redundant colon. Otherwise normal colonoscopy.    ESOPHAGOGASTRODUODENOSCOPY  03/24/2013   Mild gastritis. Otherwise normal EGD.   OPEN REDUCTION INTERNAL FIXATION (ORIF) DISTAL RADIAL FRACTURE Right 04/14/2018   Procedure: OPEN REDUCTION INTERNAL FIXATION (ORIF) RIGHT DISTAL RADIUS;  Surgeon: Leanora Cover, MD;  Location: [facility];  Service: Orthopedics;  Laterality: Right;   WRIST FRACTURE SURGERY Left      OB History   No obstetric history on file.     Family History  Problem Relation Age of Onset   Diabetes Mother    Heart disease Mother    Heart disease Father     Social History   Tobacco Use   Smoking status: Never   Smokeless tobacco:  Never  Substance Use Topics   Alcohol use: Never   Drug use: Never    Home Medications Prior to Admission medications   Medication Sig Start Date End Date Taking? Authorizing Provider  atorvastatin (LIPITOR) 10 MG tablet Take 10 mg by mouth daily. 02/09/21  Yes [provider]  B Complex-C (B-COMPLEX WITH VITAMIN C) tablet Take 1 tablet by mouth at bedtime.   Yes [provider]  Butalbital-APAP-Caffeine [phone] MG capsule Take 1 capsule by mouth every 4 (four) hours as needed for headache. 11/15/20  Yes [provider]  cholecalciferol (VITAMIN D3) 25 MCG (1000 UNIT) tablet Take 1,000 Units by mouth every evening.   Yes [provider]  DULoxetine (CYMBALTA) 60 MG capsule Take 60 mg by mouth daily. 02/21/21  Yes [provider]  fexofenadine (ALLEGRA ALLERGY) 180 MG tablet Take 180 mg by mouth every evening.   Yes [provider]  fluticasone (FLONASE) 50 MCG/ACT nasal spray Place 1 spray into both nostrils in the morning and at bedtime. 1 SPRAY IN EACH NOSTRIL TWICE DAILY   Yes [provider]  linaclotide (LINZESS) 290 MCG CAPS capsule Take 290 mcg by mouth every evening.   Yes [provider]  losartan (COZAAR) 25 MG tablet Take 25 mg by mouth daily. 01/10/21  Yes [provider]  Multiple Vitamins-Minerals (ZINC PO) Take 1 tablet by mouth at bedtime.  Yes [provider]  omeprazole (PRILOSEC) 20 MG capsule Take 20 mg by mouth at bedtime.   Yes [provider]  ondansetron (ZOFRAN-ODT) 4 MG disintegrating tablet Take 4 mg by mouth every 8 (eight) hours as needed for nausea or vomiting. 03/20/21  Yes [provider]  oxyCODONE (OXY IR/ROXICODONE) 5 MG immediate release tablet Take 5 mg by mouth every 6 (six) hours as needed for moderate pain or severe pain. 03/20/21  Yes [provider]  promethazine (PHENERGAN) 25 MG tablet Take 25 mg by mouth every 6 (six) hours as needed for  nausea or vomiting. 09/30/20  Yes [provider]  topiramate (TOPAMAX) 50 MG tablet Take 100 mg by mouth at bedtime.   Yes [provider]  vitamin C (ASCORBIC ACID) 500 MG tablet Take 500 mg by mouth every evening.   Yes [provider]  ketorolac (TORADOL) 10 MG tablet Take 10 mg by mouth every 6 (six) hours as needed for moderate pain or severe pain. 03/20/21   [provider]  oxyCODONE-acetaminophen (PERCOCET) 5-325 MG tablet 1-2 tabs po q6 hours prn pain Patient not taking: No sig reported 04/14/18   Leanora Cover, MD    Allergies    Patient has no known allergies.  Review of Systems   Review of Systems  Constitutional:  Positive for fever. Negative for appetite change and fatigue.  HENT:  Negative for congestion, ear discharge and sinus pressure.   Eyes:  Negative for discharge.  Respiratory:  Negative for cough.   Cardiovascular:  Negative for chest pain.  Gastrointestinal:  Negative for abdominal pain and diarrhea.  Genitourinary:  Positive for flank pain. Negative for frequency and hematuria.  Musculoskeletal:  Negative for back pain.  Skin:  Negative for rash.  Neurological:  Negative for seizures and headaches.  Psychiatric/Behavioral:  Negative for hallucinations.    Physical Exam Updated Vital Signs BP (!) 149/76 (BP Location: Right Arm)   Pulse 83   Temp 99.2 F (37.3 C) (Oral)   Resp 20   SpO2 98%   Physical Exam Vitals and nursing note reviewed.  Constitutional:      Appearance: She is well-developed.  HENT:     Head: Normocephalic.     Nose: Nose normal.  Eyes:     General: No scleral icterus.    Conjunctiva/sclera: Conjunctivae normal.  Neck:     Thyroid: No thyromegaly.  Cardiovascular:     Rate and Rhythm: Normal rate and regular rhythm.     Heart sounds: No murmur heard.   No friction rub. No gallop.  Pulmonary:     Breath sounds: No stridor. No wheezing or rales.  Chest:     Chest wall: No tenderness.   Abdominal:     General: There is no distension.     Tenderness: There is no abdominal tenderness. There is no rebound.  Genitourinary:    Comments: Tender right flank Musculoskeletal:        General: Normal range of motion.     Cervical back: Neck supple.  Lymphadenopathy:     Cervical: No cervical adenopathy.  Skin:    Findings: No erythema or rash.  Neurological:     Mental Status: She is alert and oriented to person, place, and time.     Motor: No abnormal muscle tone.     Coordination: Coordination normal.  Psychiatric:        Behavior: Behavior normal.    ED Results / Procedures / Treatments  Labs (all labs ordered are listed, but only abnormal results are displayed) Labs Reviewed  URINALYSIS, ROUTINE W REFLEX MICROSCOPIC - Abnormal; Notable for the following components:      Result Value   Hgb urine dipstick SMALL (*)    Leukocytes,Ua LARGE (*)    Bacteria, UA RARE (*)    Non Squamous Epithelial 0-5 (*)    All other components within normal limits  COMPREHENSIVE METABOLIC PANEL - Abnormal; Notable for the following components:   BUN 26 (*)    Creatinine, Ser 1.72 (*)    GFR, Estimated 33 (*)    All other components within normal limits  RESP PANEL BY RT-PCR (FLU A&B, COVID) [id number]  URINE CULTURE  CBC WITH DIFFERENTIAL/PLATELET  LIPASE, BLOOD    EKG None  Radiology CT Renal Stone Study  Result Date: 03/22/2021 CLINICAL DATA:  Flank pain. Kidney stones suspected. Right flank pain with fever. History of stone. EXAM: CT ABDOMEN AND PELVIS WITHOUT CONTRAST TECHNIQUE: Multidetector CT imaging of the abdomen and pelvis was performed following the standard protocol without IV contrast. COMPARISON:  03/20/2021 FINDINGS: Lower chest: Bilateral breast implants. Slight fibrosis in the lung bases. Hepatobiliary: No focal liver abnormality is seen. Status post cholecystectomy. No biliary dilatation. Pancreas: Unremarkable. No pancreatic ductal dilatation or surrounding  inflammatory changes. Spleen: Normal in size without focal abnormality. Adrenals/Urinary Tract: No adrenal gland nodules. Multiple stones are demonstrated in the distal right ureter above the ureterovesical junction. Largest measures about 7 mm in maximal diameter. At least 1 of the stones was present on the previous study but new stones are seen today. There is proximal hydronephrosis and hydroureter with stranding around the right kidney and ureter. Bilateral intrarenal stones are again demonstrated. No hydronephrosis on the left. Bladder is unremarkable. Stomach/Bowel: Stomach is within normal limits. Appendix appears normal. No evidence of bowel wall thickening, distention, or inflammatory changes. Vascular/Lymphatic: No significant vascular findings are present. No enlarged abdominal or pelvic lymph nodes. Reproductive: Uterus and bilateral adnexa are unremarkable. Other: No abdominal wall hernia or abnormality. No abdominopelvic ascites. Musculoskeletal: Slight anterior subluxation at L4-5, likely degenerative. No vertebral compression deformities. IMPRESSION: 1. Multiple stones demonstrated in the distal right ureter, increasing since prior study. Largest measures 7 mm. Moderate proximal obstruction. 2. Multiple bilateral nonobstructing intrarenal stones again demonstrated. Electronically Signed   By: Lucienne Capers M.D.   On: 03/22/2021 19:04    Procedures Procedures   Medications Ordered in ED Medications  cefTRIAXone (ROCEPHIN) 2 g in sodium chloride 0.9 % 100 mL IVPB (0 g Intravenous Stopped 03/22/21 1925)  ondansetron (ZOFRAN) injection 4 mg (4 mg Intravenous Given 03/22/21 1757)  HYDROmorphone (DILAUDID) injection 0.5 mg (0.5 mg Intravenous Given 03/22/21 1757)    ED Course  I have reviewed the triage vital signs and the nursing notes.  Pertinent labs & imaging results that were available during my care of the patient were reviewed by me and considered in my medical decision making (see  chart for details).    MDM Rules/Calculators/A&P                           Patient with multiple ureteral stones on the right.  Possible urinary tract infection.  Patient will be cultured and started on Rocephin and urology will see the patient Final Clinical Impression(s) / ED Diagnoses Final diagnoses:  Kidney stone    Rx / DC Orders ED Discharge Orders     None  Milton Ferguson, MD 03/22/21 [phone]

## 2021-03-22 NOTE — Anesthesia Preprocedure Evaluation (Addendum)
Anesthesia Evaluation  Patient identified by MRN, date of birth, ID band Patient awake    Reviewed: Allergy & Precautions, H&P , NPO status , Patient's Chart, lab work & pertinent test results  Airway Mallampati: III  TM Distance: >3 FB Neck ROM: Full    Dental no notable dental hx. (+) Teeth Intact, Dental Advisory Given   Pulmonary neg pulmonary ROS,    Pulmonary exam normal breath sounds clear to auscultation       Cardiovascular negative cardio ROS   Rhythm:Regular Rate:Normal     Neuro/Psych  Headaches, Anxiety Depression    GI/Hepatic Neg liver ROS, GERD  Medicated,  Endo/Other  negative endocrine ROS  Renal/GU Renal disease  negative genitourinary   Musculoskeletal  (+) Arthritis , Osteoarthritis,    Abdominal   Peds  Hematology negative hematology ROS (+)   Anesthesia Other Findings   Reproductive/Obstetrics negative OB ROS                            Anesthesia Physical Anesthesia Plan  ASA: 2 and emergent  Anesthesia Plan: General   Post-op Pain Management:    Induction: Intravenous, Rapid sequence and Cricoid pressure planned  PONV Risk Score and Plan: 4 or greater and Ondansetron, Dexamethasone and Midazolam  Airway Management Planned: Oral ETT  Additional Equipment:   Intra-op Plan:   Post-operative Plan: Extubation in OR  Informed Consent: I have reviewed the patients History and Physical, chart, labs and discussed the procedure including the risks, benefits and alternatives for the proposed anesthesia with the patient or authorized representative who has indicated his/her understanding and acceptance.     Dental advisory given  Plan Discussed with: CRNA  Anesthesia Plan Comments:        Anesthesia Quick Evaluation

## 2021-03-22 NOTE — ED Provider Notes (Signed)
Emergency Medicine Provider Triage Evaluation Note  Felicia Ayala , a 65 y.o. female  was evaluated in triage.  Pt complains of right flank pain and fever.  Patient reports she was diagnosed with a kidney stone at [facility] emergency department on Wednesday.  Patient has had constant right flank pain since then.  Pain was worse last night.  Patient reports that she woke this morning febrile at 99.9 F.  [facility] contacted [facility] and was instructed to come to the emergency department.  Patient reports having 1 episode of nausea and vomiting this morning.  No hematemesis or coffee-ground emesis  Review of Systems  Positive: Flank pain, fevers, nausea, vomiting Negative: Dysuria, hematuria, urinary frequency,  Physical Exam  BP (!) 159/77   Pulse 78   Temp 99.6 F (37.6 C) (Oral)   Resp 16   SpO2 100%  Gen:   Awake, no distress   Resp:  Normal effort  MSK:   Moves extremities without difficulty  Other:  Abdomen soft, nondistended, nontender, no guarding or rebound tenderness.  Medical Decision Making  Medically screening exam initiated at 3:32 PM.  Appropriate orders placed.  Karinna Evelena Huguley was informed that the remainder of the evaluation will be completed by another provider, this initial triage assessment does not replace that evaluation, and the importance of remaining in the ED until their evaluation is complete.  The patient appears stable so that the remainder of the work up may be completed by another provider.      Loni Beckwith, PA-C 03/22/21 1534    Milton Ferguson, MD 03/24/21 1534

## 2021-03-22 NOTE — Anesthesia Postprocedure Evaluation (Signed)
Anesthesia Post Note  Patient: Felicia Ayala  Procedure(s) Performed: CYSTOSCOPY WITH RETROGRADE PYELOGRAM/URETERAL STENT PLACEMENT (Bilateral)     Patient location during evaluation: PACU Anesthesia Type: General Level of consciousness: awake and alert Pain management: pain level controlled Vital Signs Assessment: post-procedure vital signs reviewed and stable Respiratory status: spontaneous breathing, nonlabored ventilation and respiratory function stable Cardiovascular status: blood pressure returned to baseline and stable Postop Assessment: no apparent nausea or vomiting Anesthetic complications: no   No notable events documented.  Last Vitals:  Vitals:   03/22/21 2130 03/22/21 2142  BP: 134/63 136/72  Pulse: 88 88  Resp: 16 18  Temp:  37.3 C  SpO2: 96% 95%    Last Pain:  Vitals:   03/22/21 2142  TempSrc: Oral  PainSc:                  Annaleigha Woo,W. EDMOND

## 2021-03-22 NOTE — Brief Op Note (Signed)
03/22/2021  8:49 PM  PATIENT:  Felicia Ayala  65 y.o. female  PRE-OPERATIVE DIAGNOSIS:  Bilateral renal stones,fever  POST-OPERATIVE DIAGNOSIS:  Bilateral renal stones,fever  PROCEDURE:  Procedure(s): CYSTOSCOPY WITH RETROGRADE PYELOGRAM/URETERAL STENT PLACEMENT (Bilateral)  SURGEON:  Surgeon(s) and Role:    * Alexis Frock, MD - Primary  PHYSICIAN ASSISTANT:   ASSISTANTS: none   ANESTHESIA:   general  EBL:  minimal   BLOOD ADMINISTERED:none  DRAINS: none   LOCAL MEDICATIONS USED:  NONE  SPECIMEN:  No Specimen  DISPOSITION OF SPECIMEN:  N/A  COUNTS:  YES  TOURNIQUET:  * No tourniquets in log *  DICTATION: .Other Dictation: Dictation Number [id number]  PLAN OF CARE: Admit for overnight observation  PATIENT DISPOSITION:  PACU - hemodynamically stable.   Delay start of Pharmacological VTE agent (>24hrs) due to surgical blood loss or risk of bleeding: not applicable

## 2021-03-22 NOTE — ED Triage Notes (Signed)
Pt arrived via POV, c/o right flank pain, was seen at [facility], told she had a kidney stone, woke this morning with 99.9 temp, urology instructed to go to ED.

## 2021-03-22 NOTE — Transfer of Care (Signed)
Immediate Anesthesia Transfer of Care Note  Patient: Felicia Ayala  Procedure(s) Performed: CYSTOSCOPY WITH RETROGRADE PYELOGRAM/URETERAL STENT PLACEMENT (Bilateral)  Patient Location: PACU  Anesthesia Type:General  Level of Consciousness: awake, drowsy and patient cooperative  Airway & Oxygen Therapy: Patient Spontanous Breathing and Patient connected to face mask oxygen  Post-op Assessment: Report given to RN and Post -op Vital signs reviewed and stable  Post vital signs: Reviewed and stable  Last Vitals:  Vitals Value Taken Time  BP 127/64 03/22/21 2100  Temp    Pulse 82 03/22/21 2059  Resp 13 03/22/21 2100  SpO2 100 % 03/22/21 2059  Vitals shown include unvalidated device data.  Last Pain:  Vitals:   03/22/21 1827  TempSrc:   PainSc: 4          Complications: No notable events documented.

## 2021-03-22 NOTE — H&P (Signed)
Felicia Ayala is an 65 y.o. female.    Chief Complaint: Rt Ureteral + Bilateral Renal Stones, Urinary Tract Infection  HPI:   1 - Rt Ureteral + Bilateral Renal Stones - h/o recurrent stones. Rt distal ureteral conglomerate of stones, largest 72m with hydro + bilateral scattered renal stones (abtou 1cm volume each kidney) by ER CT x 2 03/2021.   2 - Urinary Tract Infection - fevers, malaise and scant bacteruria on UA x few days. No sig nificant leukocytosis.  PMH sig for migraine, IBS, no ischemic CV disease / blood thinners.  Today "Felicia Ayala is seen for urgent eval of above. She was diagnosed with ureteral sone in [place] recently, but now has fevers. C19 screen negative. Only water today 9 hours ago.    Past Medical History:  Diagnosis Date   Anxiety    Arthritis    Chronic constipation    Closed fracture of right distal radius    Depression    GERD (gastroesophageal reflux disease)    Headache    IBS (irritable bowel syndrome)    Osteopenia     Past Surgical History:  Procedure Laterality Date   CHOLECYSTECTOMY     COLONOSCOPY  06/20/2010   Minimal melanosis. Redundant colon. Otherwise normal colonoscopy.    ESOPHAGOGASTRODUODENOSCOPY  03/24/2013   Mild gastritis. Otherwise normal EGD.   OPEN REDUCTION INTERNAL FIXATION (ORIF) DISTAL RADIAL FRACTURE Right 04/14/2018   Procedure: OPEN REDUCTION INTERNAL FIXATION (ORIF) RIGHT DISTAL RADIUS;  Surgeon: KLeanora Cover MD;  Location: [facility]  Service: Orthopedics;  Laterality: Right;   WRIST FRACTURE SURGERY Left     Family History  Problem Relation Age of Onset   Diabetes Mother    Heart disease Mother    Heart disease Father    Social History:  reports that she has never smoked. She has never used smokeless tobacco. She reports that she does not drink alcohol and does not use drugs.  Allergies: No Known Allergies  (Not in a hospital admission)   Results for orders placed or performed during the  hospital encounter of 03/22/21 (from the past 48 hour(s))  Urinalysis, Routine w reflex microscopic     Status: Abnormal   Collection Time: 03/22/21  3:44 PM  Result Value Ref Range   Color, Urine YELLOW YELLOW   APPearance CLEAR CLEAR   Specific Gravity, Urine 1.013 1.005 - 1.030   pH 7.0 5.0 - 8.0   Glucose, UA NEGATIVE NEGATIVE mg/dL   Hgb urine dipstick SMALL (A) NEGATIVE   Bilirubin Urine NEGATIVE NEGATIVE   Ketones, ur NEGATIVE NEGATIVE mg/dL   Protein, ur NEGATIVE NEGATIVE mg/dL   Nitrite NEGATIVE NEGATIVE   Leukocytes,Ua LARGE (A) NEGATIVE   RBC / HPF 6-10 0 - 5 RBC/hpf   WBC, UA 21-50 0 - 5 WBC/hpf   Bacteria, UA RARE (A) NONE SEEN   Squamous Epithelial / LPF 0-5 0 - 5   Mucus PRESENT    Non Squamous Epithelial 0-5 (A) NONE SEEN    Comment: Performed at [facility] [address] [place] [address] CBC with Differential     Status: None   Collection Time: 03/22/21  5:09 PM  Result Value Ref Range   WBC 7.6 4.0 - 10.5 K/uL   RBC 4.49 3.87 - 5.11 MIL/uL   Hemoglobin 13.5 12.0 - 15.0 g/dL   HCT 41.1 36.0 - 46.0 %   MCV 91.5 80.0 - 100.0 fL   MCH 30.1 26.0 -  34.0 pg   MCHC 32.8 30.0 - 36.0 g/dL   RDW 12.8 11.5 - 15.5 %   Platelets 231 150 - 400 K/uL   nRBC 0.0 0.0 - 0.2 %   Neutrophils Relative % 64 %   Neutro Abs 4.9 1.7 - 7.7 K/uL   Lymphocytes Relative 23 %   Lymphs Abs 1.7 0.7 - 4.0 K/uL   Monocytes Relative 11 %   Monocytes Absolute 0.8 0.1 - 1.0 K/uL   Eosinophils Relative 2 %   Eosinophils Absolute 0.1 0.0 - 0.5 K/uL   Basophils Relative 0 %   Basophils Absolute 0.0 0.0 - 0.1 K/uL   Immature Granulocytes 0 %   Abs Immature Granulocytes 0.01 0.00 - 0.07 K/uL    Comment: Performed at [facility], [place] [address] [place], [place] [address]  Comprehensive metabolic panel     Status: Abnormal   Collection Time: 03/22/21  6:00 PM  Result Value Ref Range   Sodium 137 135 - 145 mmol/L   Potassium 4.1 3.5 - 5.1  mmol/L   Chloride 104 98 - 111 mmol/L   CO2 24 22 - 32 mmol/L   Glucose, Bld 96 70 - 99 mg/dL    Comment: Glucose reference range applies only to samples taken after fasting for at least 8 hours.   BUN 26 (H) 8 - 23 mg/dL   Creatinine, Ser 1.72 (H) 0.44 - 1.00 mg/dL   Calcium 8.9 8.9 - 10.3 mg/dL   Total Protein 6.8 6.5 - 8.1 g/dL   Albumin 3.9 3.5 - 5.0 g/dL   AST 28 15 - 41 U/L   ALT 22 0 - 44 U/L   Alkaline Phosphatase 74 38 - 126 U/L   Total Bilirubin 0.8 0.3 - 1.2 mg/dL   GFR, Estimated 33 (L) >60 mL/min    Comment: (NOTE) Calculated using the CKD-EPI Creatinine Equation (2021)    Anion gap 9 5 - 15    Comment: Performed at [facility], [place] [address] [place], [place] [address]  Lipase, blood     Status: None   Collection Time: 03/22/21  6:00 PM  Result Value Ref Range   Lipase 30 11 - 51 U/L    Comment: Performed at [facility], [place] [address] [place], [place] [address]  Resp Panel by RT-PCR (Flu A&B, Covid) Nasopharyngeal Swab     Status: None   Collection Time: 03/22/21  6:05 PM   Specimen: Nasopharyngeal Swab; Nasopharyngeal(NP) swabs in vial transport medium  Result Value Ref Range   SARS Coronavirus 2 by RT PCR NEGATIVE NEGATIVE    Comment: (NOTE) SARS-CoV-2 target nucleic acids are NOT DETECTED.  The SARS-CoV-2 RNA is generally detectable in upper respiratory specimens during the acute phase of infection. The lowest concentration of SARS-CoV-2 viral copies this assay can detect is 138 copies/mL. A negative result does not preclude SARS-Cov-2 infection and should not be used as the sole basis for treatment or other patient management decisions. A negative result may occur with  improper specimen collection/handling, submission of specimen other than nasopharyngeal swab, presence of viral mutation(s) within the areas targeted by this assay, and inadequate number of viral copies(<138 copies/mL). A negative result must be  combined with clinical observations, patient history, and epidemiological information. The expected result is Negative.  Fact Sheet for Patients:  EntrepreneurPulse.com.au  Fact Sheet for Healthcare Providers:  IncredibleEmployment.be  This test is no t yet approved or cleared by the [place] FDA  and  has been authorized for detection and/or diagnosis of SARS-CoV-2 by FDA under an Emergency Use Authorization (EUA). This EUA will remain  in effect (meaning this test can be used) for the duration of the COVID-19 declaration under Section 564(b)(1) of the Act, 21 U.S.C.section 360bbb-3(b)(1), unless the authorization is terminated  or revoked sooner.       Influenza A by PCR NEGATIVE NEGATIVE   Influenza B by PCR NEGATIVE NEGATIVE    Comment: (NOTE) The Xpert Xpress SARS-CoV-2/FLU/RSV plus assay is intended as an aid in the diagnosis of influenza from Nasopharyngeal swab specimens and should not be used as a sole basis for treatment. Nasal washings and aspirates are unacceptable for Xpert Xpress SARS-CoV-2/FLU/RSV testing.  Fact Sheet for Patients: EntrepreneurPulse.com.au  Fact Sheet for Healthcare Providers: IncredibleEmployment.be  This test is not yet approved or cleared by the [place] FDA and has been authorized for detection and/or diagnosis of SARS-CoV-2 by FDA under an Emergency Use Authorization (EUA). This EUA will remain in effect (meaning this test can be used) for the duration of the COVID-19 declaration under Section 564(b)(1) of the Act, 21 U.S.C. section 360bbb-3(b)(1), unless the authorization is terminated or revoked.  Performed at [facility], [place] [address] [place], [place] [address]    CT Renal Stone Study  Result Date: 03/22/2021 CLINICAL DATA:  Flank pain. Kidney stones suspected. Right flank pain with fever. History of stone. EXAM: CT ABDOMEN AND  PELVIS WITHOUT CONTRAST TECHNIQUE: Multidetector CT imaging of the abdomen and pelvis was performed following the standard protocol without IV contrast. COMPARISON:  03/20/2021 FINDINGS: Lower chest: Bilateral breast implants. Slight fibrosis in the lung bases. Hepatobiliary: No focal liver abnormality is seen. Status post cholecystectomy. No biliary dilatation. Pancreas: Unremarkable. No pancreatic ductal dilatation or surrounding inflammatory changes. Spleen: Normal in size without focal abnormality. Adrenals/Urinary Tract: No adrenal gland nodules. Multiple stones are demonstrated in the distal right ureter above the ureterovesical junction. Largest measures about 7 mm in maximal diameter. At least 1 of the stones was present on the previous study but new stones are seen today. There is proximal hydronephrosis and hydroureter with stranding around the right kidney and ureter. Bilateral intrarenal stones are again demonstrated. No hydronephrosis on the left. Bladder is unremarkable. Stomach/Bowel: Stomach is within normal limits. Appendix appears normal. No evidence of bowel wall thickening, distention, or inflammatory changes. Vascular/Lymphatic: No significant vascular findings are present. No enlarged abdominal or pelvic lymph nodes. Reproductive: Uterus and bilateral adnexa are unremarkable. Other: No abdominal wall hernia or abnormality. No abdominopelvic ascites. Musculoskeletal: Slight anterior subluxation at L4-5, likely degenerative. No vertebral compression deformities. IMPRESSION: 1. Multiple stones demonstrated in the distal right ureter, increasing since prior study. Largest measures 7 mm. Moderate proximal obstruction. 2. Multiple bilateral nonobstructing intrarenal stones again demonstrated. Electronically Signed   By: Lucienne Capers M.D.   On: 03/22/2021 19:04    Review of Systems  Constitutional:  Positive for chills and fever.  HENT: Negative.    Eyes: Negative.   Respiratory: Negative.     Cardiovascular: Negative.   Gastrointestinal: Negative.   Endocrine: Negative.   Genitourinary:  Positive for flank pain and urgency.  Allergic/Immunologic: Negative.   Neurological: Negative.   Hematological: Negative.   Psychiatric/Behavioral: Negative.    All other systems reviewed and are negative.  Blood pressure (!) 149/76, pulse 83, temperature 99.2 F (37.3 C), temperature source Oral, resp. rate 20, SpO2 98 %. Physical Exam Vitals reviewed.  Constitutional:  Comments: Very pleasant. Husband at bedside as well.   HENT:     Head: Normocephalic.     Nose: Nose normal.  Eyes:     Pupils: Pupils are equal, round, and reactive to light.  Cardiovascular:     Rate and Rhythm: Normal rate.  Pulmonary:     Effort: Pulmonary effort is normal.  Abdominal:     Comments: Mild obesity.   Genitourinary:    Comments: Minimal CVAT at present after pain meds.  Musculoskeletal:        General: Normal range of motion.     Cervical back: Normal range of motion.  Neurological:     General: No focal deficit present.     Mental Status: She is alert.  Psychiatric:        Mood and Affect: Mood normal.     Assessment/Plan  1 - Rt Ureteral + Bilateral Renal Stones - rec bilateral stents today for renal decompression, then bilateral ureteroscopy in elective setting after clears infectious parameters as safest optoin. She is in agreement. Risks, benefits, alternatives, expected peri-op course discussed.   2 - Urinary Tract Infection - empiric rocephin in house.  Goals of admisison are renal decompression, no high grade fevers x 12-24 hours, then DC on PO ABX course.  Alexis Frock, MD 03/22/2021, 8:00 PM

## 2021-03-22 NOTE — Anesthesia Procedure Notes (Signed)
Procedure Name: Intubation Date/Time: 03/22/2021 8:31 PM Performed by: Raenette Rover, CRNA Pre-anesthesia Checklist: Patient identified, Emergency Drugs available, Suction available and Patient being monitored Patient Re-evaluated:Patient Re-evaluated prior to induction Oxygen Delivery Method: Circle system utilized Preoxygenation: Pre-oxygenation with 100% oxygen Induction Type: IV induction, Rapid sequence and Cricoid Pressure applied Laryngoscope Size: Mac and 3 Grade View: Grade I Tube type: Oral Tube size: 7.0 mm Number of attempts: 1 Airway Equipment and Method: Stylet Placement Confirmation: ETT inserted through vocal cords under direct vision, positive ETCO2 and breath sounds checked- equal and bilateral Secured at: 21 cm Tube secured with: Tape Dental Injury: Teeth and Oropharynx as per pre-operative assessment

## 2021-03-22 NOTE — Op Note (Signed)
NAMENAOKO, COPPA MEDICAL RECORD NO: [id number] ACCOUNT NO: [id number] DATE OF BIRTH: 02-Dec-1955 FACILITY: [facility] LOCATION: [facility] PHYSICIAN: Alexis Frock, MD  Operative Report   DATE OF PROCEDURE: 03/22/2021  PREOPERATIVE DIAGNOSES:  Right ureteral and bilateral renal stones, fevers.  PROCEDURE PERFORMED: 1.  Cystoscopy, bilateral retrograde pyelograms interpretation. 2.  Insertion of bilateral ureteral stents, 5 x 24 Polaris, no tether.  ESTIMATED BLOOD LOSS:  Nil.  COMPLICATIONS:  None.  SPECIMENS:  None.  DRAINS:  None.  INDICATIONS:  The patient is a 65 year old lady with history of recurrent urolithiasis.  She was found on workup of colicky flank pain have a right distal ureteral stone by CT scan in the emergency room in [place] recently. She subsequently developed  fevers, called our office, which we have not seen her in years. The findings were discussed.  Given her new fevers, this is concerning for impending severe infection. She presented to [facility] emergency room where she was reimaged and again found to  have a conglomerate of right distal ureteral stone with hydronephrosis, bilateral renal stones, bacteriuria and low-grade fevers.  Options were discussed for management including recommend path of urgent renal decompression with clearance of infectious  parameters before definitive stone management in a staged fashion and she wished to proceed with renal decompression with stenting today.  Informed consent was obtained and placed in the medical record.  DESCRIPTION OF PROCEDURE:  The patient being verified, the procedure being cystoscopy, bilateral stents was confirmed.  Procedure timeout was performed and intravenous antibiotics were administered and general LMA anesthesia introduced.  The patient was  placed into a low lithotomy position.  A sterile field was created, prepping and draping the patient's vagina, introitus and proximal thighs using iodine.   Cystourethroscopy was performed using 21-French rigid cystoscope with offset lens.  Inspection of  the urinary bladder revealed no diverticula, calcifications or papillary lesions.  The left ureteral orifice was cannulated with a 6-French end-hole catheter and left retrograde pyelogram was obtained.  Left retrograde pyelogram demonstrated single left ureter with single system left kidney.  No obvious filling defects or narrowing noted.  A 0.038 ZIPwire was advanced to the level of the upper pole and a new 5 x 24 Polaris type stent was placed using  fluoroscopic guidance.  Good proximal and distal plane were noted.  Next, right retrograde pyelogram was obtained.  Right retrograde pyelogram demonstrated a single right ureter with a single system right kidney.  There was multifocal filling defects in the distal ureter consistent with known stone with moderate hydronephrosis with some tortuosity above this.  A 0.038  ZIPwire was advanced to the level of upper pole and a new 5 x 24 Polaris type stent was placed using fluoroscopic guidance.  Good proximal and distal plane were noted.  Bladder was emptied per cystoscope.  Procedure was then terminated.  The patient  tolerated the procedure well, with no immediate perioperative complications.  The patient was taken to postanesthesia care in stable condition.  PLAN:  For observation admission, likely discharge home tomorrow afternoon as long as she remains without high-grade fevers.   SHW D: 03/22/2021 8:52:53 pm T: 03/22/2021 9:29:00 pm  JOB: [id number]

## 2021-03-23 ENCOUNTER — Encounter (HOSPITAL_COMMUNITY): Payer: Self-pay | Admitting: Urology

## 2021-03-23 LAB — BASIC METABOLIC PANEL
Anion gap: 8 (ref 5–15)
BUN: 25 mg/dL — ABNORMAL HIGH (ref 8–23)
CO2: 23 mmol/L (ref 22–32)
Calcium: 8.9 mg/dL (ref 8.9–10.3)
Chloride: 106 mmol/L (ref 98–111)
Creatinine, Ser: 1.54 mg/dL — ABNORMAL HIGH (ref 0.44–1.00)
GFR, Estimated: 37 mL/min — ABNORMAL LOW (ref >60)
Glucose, Bld: 188 mg/dL — ABNORMAL HIGH (ref 70–99)
Potassium: 3.8 mmol/L (ref 3.5–5.1)
Sodium: 137 mmol/L (ref 135–145)

## 2021-03-23 LAB — CBC
HCT: 37.6 % (ref 36.0–46.0)
Hemoglobin: 12.7 g/dL (ref 12.0–15.0)
MCH: 31.2 pg (ref 26.0–34.0)
MCHC: 33.8 g/dL (ref 30.0–36.0)
MCV: 92.4 fL (ref 80.0–100.0)
Platelets: 210 10*3/uL (ref 150–400)
RBC: 4.07 MIL/uL (ref 3.87–5.11)
RDW: 12.2 % (ref 11.5–15.5)
WBC: 5.4 10*3/uL (ref 4.0–10.5)
nRBC: 0 % (ref 0.0–0.2)

## 2021-03-23 MED ORDER — CEPHALEXIN 500 MG PO CAPS: 500.0000 mg | ORAL_CAPSULE | Freq: Two times a day (BID) | ORAL | 0 refills | Status: AC

## 2021-03-23 MED ORDER — OXYCODONE-ACETAMINOPHEN 5-325 MG PO TABS: ORAL_TABLET | ORAL | 0 refills | Status: DC

## 2021-03-23 NOTE — Discharge Summary (Signed)
Physician Discharge Summary  Patient ID: Felicia Ayala MRN: [id number] DOB/AGE: 1956-02-27 65 y.o.  Admit date: 03/22/2021 Discharge date: 03/23/2021  Admission Diagnoses: Rt ureteral, Bilateral Renal Stones, Acute Renal Failure, Mild UTI  Discharge Diagnoses:  Active Problems:   Bilateral renal stones   Ureteral stone with hydronephrosis   Discharged Condition: good  Hospital Course:   Pt underwent urgent BILATERAL ureteral stenting on 03/22/2021 for Rt ureteral / bilateral renal stones with Cr 1.7 and low grade fevers. She was continued on IV ABX post-op and hydration. By the AM of POD 1, she is fever free for over 12 hours, Cr 1.5, voiding, and felt to be adequate for dishcarge. UCX 8/20 pending. She will go out on empiric keflex as she has done well on rocephin in house.   Consults: None  Significant Diagnostic Studies: labs: as per above  Treatments: surgery: as per above + IV ABX  Discharge Exam: Blood pressure 125/69, pulse 68, temperature 98.3 F (36.8 C), temperature source Oral, resp. rate 17, SpO2 98 %. General appearance: alert, cooperative, and very pleasant, at baseline.  Eyes: negative Nose: Nares normal. Septum midline. Mucosa normal. No drainage or sinus tenderness. Throat: lips, mucosa, and tongue normal; teeth and gums normal Neck: no adenopathy Back: symmetric, no curvature. ROM normal. No CVA tenderness. Resp: non-labored on room air.  Cardio: Nl; rate GI: soft, non-tender; bowel sounds normal; no masses,  no organomegaly Extremities: extremities normal, atraumatic, no cyanosis or edema Skin: Skin color, texture, turgor normal. No rashes or lesions Neurologic: Grossly normal  Disposition: HOME     Signed: Alexis Frock 03/23/2021, 9:24 AM

## 2021-03-23 NOTE — Progress Notes (Signed)
Patient was given discharge instructions, and all questions were answered.  Patient was stable for discharge and was taken to the main exit by wheelchair. 

## 2021-03-24 LAB — URINE CULTURE

## 2021-03-26 ENCOUNTER — Other Ambulatory Visit: Payer: Self-pay | Admitting: Urology

## 2021-04-01 ENCOUNTER — Encounter (HOSPITAL_BASED_OUTPATIENT_CLINIC_OR_DEPARTMENT_OTHER): Payer: Self-pay | Admitting: Urology

## 2021-04-01 ENCOUNTER — Other Ambulatory Visit: Payer: Self-pay

## 2021-04-01 DIAGNOSIS — Z973 Presence of spectacles and contact lenses: Secondary | ICD-10-CM

## 2021-04-01 DIAGNOSIS — F419 Anxiety disorder, unspecified: Secondary | ICD-10-CM

## 2021-04-01 HISTORY — DX: Presence of spectacles and contact lenses: Z97.3

## 2021-04-01 HISTORY — DX: Anxiety disorder, unspecified: F41.9

## 2021-04-01 NOTE — Progress Notes (Signed)
Spoke w/ via phone for pre-op interview---pt Lab needs dos----     none        Lab results------ekg 40-14-2022 [facility] on chart labd sone 03-23-2021 [facility] in epic COVID test -----patient states asymptomatic no test needed Arrive at -------1115 am 04-04-2021 NPO after MN NO Solid Food.  Clear liquids from MN until---1015 am Med rec completed Medications to take morning of surgery -----duloxetine, flonase Diabetic medication -----n/a Patient instructed no nail polish to be worn day of surgery Patient instructed to bring photo id and insurance card day of surgery Patient aware to have Driver (ride ) / caregiver    spouse brooks for 24 hours after surgery  Patient Special Instructions -----none Pre-Op special Istructions -----none Patient verbalized understanding of instructions that were given at this phone interview. Patient denies shortness of breath, chest pain, fever, cough at this phone interview.

## 2021-04-04 ENCOUNTER — Other Ambulatory Visit: Payer: Self-pay

## 2021-04-04 ENCOUNTER — Encounter (HOSPITAL_BASED_OUTPATIENT_CLINIC_OR_DEPARTMENT_OTHER)
Admission: RE | Disposition: A | Discharge status: Home / Self Care | Payer: Self-pay | Source: Home / Self Care | Attending: Urology

## 2021-04-04 ENCOUNTER — Encounter (HOSPITAL_BASED_OUTPATIENT_CLINIC_OR_DEPARTMENT_OTHER): Payer: Self-pay | Admitting: Urology

## 2021-04-04 ENCOUNTER — Ambulatory Visit (HOSPITAL_BASED_OUTPATIENT_CLINIC_OR_DEPARTMENT_OTHER)
Admission: RE | Admit: 2021-04-04 | Discharge: 2021-04-04 | Disposition: A | Discharge status: Home / Self Care | Payer: BC Managed Care – PPO | Attending: Urology | Admitting: Urology

## 2021-04-04 ENCOUNTER — Ambulatory Visit (HOSPITAL_BASED_OUTPATIENT_CLINIC_OR_DEPARTMENT_OTHER): Payer: BC Managed Care – PPO | Admitting: Certified Registered"

## 2021-04-04 DIAGNOSIS — Z8616 Personal history of COVID-19: Secondary | ICD-10-CM | POA: Diagnosis not present

## 2021-04-04 DIAGNOSIS — K589 Irritable bowel syndrome without diarrhea: Secondary | ICD-10-CM | POA: Diagnosis not present

## 2021-04-04 DIAGNOSIS — N132 Hydronephrosis with renal and ureteral calculous obstruction: Secondary | ICD-10-CM | POA: Insufficient documentation

## 2021-04-04 DIAGNOSIS — Z8249 Family history of ischemic heart disease and other diseases of the circulatory system: Secondary | ICD-10-CM | POA: Diagnosis not present

## 2021-04-04 DIAGNOSIS — Z833 Family history of diabetes mellitus: Secondary | ICD-10-CM | POA: Diagnosis not present

## 2021-04-04 DIAGNOSIS — Z87440 Personal history of urinary (tract) infections: Secondary | ICD-10-CM | POA: Insufficient documentation

## 2021-04-04 DIAGNOSIS — Z87442 Personal history of urinary calculi: Secondary | ICD-10-CM | POA: Diagnosis not present

## 2021-04-04 HISTORY — DX: Personal history of urinary calculi: Z87.442

## 2021-04-04 HISTORY — DX: Essential (primary) hypertension: I10

## 2021-04-04 HISTORY — PX: HOLMIUM LASER APPLICATION: SHX5852

## 2021-04-04 HISTORY — PX: CYSTOSCOPY WITH RETROGRADE PYELOGRAM, URETEROSCOPY AND STENT PLACEMENT: SHX5789

## 2021-04-04 SURGERY — CYSTOURETEROSCOPY, WITH RETROGRADE PYELOGRAM AND STENT INSERTION
Anesthesia: General | Site: Ureter | Laterality: Bilateral

## 2021-04-04 MED ORDER — LACTATED RINGERS IV SOLN: INTRAVENOUS | Status: DC

## 2021-04-04 MED ORDER — DEXAMETHASONE SODIUM PHOSPHATE 10 MG/ML IJ SOLN
INTRAMUSCULAR | Status: AC
  Filled 2021-04-04: qty 1

## 2021-04-04 MED ORDER — PROPOFOL 10 MG/ML IV BOLUS
INTRAVENOUS | Status: DC | PRN
  Administered 2021-04-04: 200 mg via INTRAVENOUS

## 2021-04-04 MED ORDER — OXYCODONE HCL 5 MG/5ML PO SOLN
5.0000 mg | Freq: Once | ORAL | Status: DC | PRN
Start: 2021-04-04 — End: 2021-04-04

## 2021-04-04 MED ORDER — FENTANYL CITRATE (PF) 100 MCG/2ML IJ SOLN
INTRAMUSCULAR | Status: AC
  Filled 2021-04-04: qty 2

## 2021-04-04 MED ORDER — ONDANSETRON HCL 4 MG/2ML IJ SOLN
INTRAMUSCULAR | Status: DC | PRN
  Administered 2021-04-04: 4 mg via INTRAVENOUS

## 2021-04-04 MED ORDER — LIDOCAINE HCL (PF) 2 % IJ SOLN
INTRAMUSCULAR | Status: AC
  Filled 2021-04-04: qty 5

## 2021-04-04 MED ORDER — ARTIFICIAL TEARS OPHTHALMIC OINT
TOPICAL_OINTMENT | OPHTHALMIC | Status: AC
  Filled 2021-04-04: qty 3.5

## 2021-04-04 MED ORDER — FENTANYL CITRATE (PF) 100 MCG/2ML IJ SOLN: 25.0000 ug | INTRAMUSCULAR | Status: DC | PRN

## 2021-04-04 MED ORDER — SODIUM CHLORIDE 0.9 % IR SOLN
Status: DC | PRN
  Administered 2021-04-04: 3000 mL

## 2021-04-04 MED ORDER — DEXAMETHASONE SODIUM PHOSPHATE 10 MG/ML IJ SOLN
INTRAMUSCULAR | Status: DC | PRN
  Administered 2021-04-04: 5 mg via INTRAVENOUS

## 2021-04-04 MED ORDER — ONDANSETRON HCL 4 MG/2ML IJ SOLN: 4.0000 mg | Freq: Once | INTRAMUSCULAR | Status: DC | PRN

## 2021-04-04 MED ORDER — WHITE PETROLATUM EX OINT
TOPICAL_OINTMENT | CUTANEOUS | Status: AC
  Filled 2021-04-04: qty 5

## 2021-04-04 MED ORDER — KETOROLAC TROMETHAMINE 30 MG/ML IJ SOLN
INTRAMUSCULAR | Status: AC
  Filled 2021-04-04: qty 1

## 2021-04-04 MED ORDER — EPHEDRINE SULFATE-NACL 50-0.9 MG/10ML-% IV SOSY
PREFILLED_SYRINGE | INTRAVENOUS | Status: DC | PRN
  Administered 2021-04-04: 15 mg via INTRAVENOUS
  Administered 2021-04-04: 10 mg via INTRAVENOUS

## 2021-04-04 MED ORDER — GENTAMICIN SULFATE 40 MG/ML IJ SOLN
5.0000 mg/kg | INTRAVENOUS | Status: AC
  Administered 2021-04-04: 340 mg via INTRAVENOUS
  Filled 2021-04-04: qty 8.5

## 2021-04-04 MED ORDER — EPHEDRINE 5 MG/ML INJ
INTRAVENOUS | Status: AC
  Filled 2021-04-04: qty 5

## 2021-04-04 MED ORDER — LIDOCAINE 2% (20 MG/ML) 5 ML SYRINGE
INTRAMUSCULAR | Status: DC | PRN
  Administered 2021-04-04: 60 mg via INTRAVENOUS

## 2021-04-04 MED ORDER — MIDAZOLAM HCL 2 MG/2ML IJ SOLN
INTRAMUSCULAR | Status: AC
  Filled 2021-04-04: qty 2

## 2021-04-04 MED ORDER — OXYCODONE HCL 5 MG PO TABS: 5.0000 mg | ORAL_TABLET | Freq: Once | ORAL | Status: DC | PRN

## 2021-04-04 MED ORDER — ONDANSETRON HCL 4 MG/2ML IJ SOLN
INTRAMUSCULAR | Status: AC
  Filled 2021-04-04: qty 2

## 2021-04-04 MED ORDER — MIDAZOLAM HCL 2 MG/2ML IJ SOLN
INTRAMUSCULAR | Status: DC | PRN
  Administered 2021-04-04: 2 mg via INTRAVENOUS

## 2021-04-04 MED ORDER — KETOROLAC TROMETHAMINE 30 MG/ML IJ SOLN
INTRAMUSCULAR | Status: DC | PRN
  Administered 2021-04-04: 30 mg via INTRAVENOUS

## 2021-04-04 MED ORDER — FENTANYL CITRATE (PF) 100 MCG/2ML IJ SOLN
INTRAMUSCULAR | Status: DC | PRN
  Administered 2021-04-04: 25 ug via INTRAVENOUS
  Administered 2021-04-04: 50 ug via INTRAVENOUS
  Administered 2021-04-04: 25 ug via INTRAVENOUS

## 2021-04-04 MED ORDER — OXYCODONE-ACETAMINOPHEN 5-325 MG PO TABS: ORAL_TABLET | ORAL | 0 refills | Status: AC

## 2021-04-04 MED ORDER — CEPHALEXIN 500 MG PO CAPS: 500.0000 mg | ORAL_CAPSULE | Freq: Two times a day (BID) | ORAL | 0 refills | Status: AC

## 2021-04-04 MED ORDER — IOHEXOL 300 MG/ML  SOLN
INTRAMUSCULAR | Status: DC | PRN
  Administered 2021-04-04: 19 mL

## 2021-04-04 SURGICAL SUPPLY — 27 items
BAG DRAIN URO-CYSTO SKYTR STRL (DRAIN) ×2 IMPLANT
BAG DRN UROCATH (DRAIN) ×1
BASKET LASER NITINOL 1.9FR (BASKET) ×2 IMPLANT
BSKT STON RTRVL 120 1.9FR (BASKET) ×1
CATH INTERMIT  6FR 70CM (CATHETERS) ×2 IMPLANT
CLOTH BEACON ORANGE TIMEOUT ST (SAFETY) ×2 IMPLANT
FIBER LASER FLEXIVA 365 (UROLOGICAL SUPPLIES) IMPLANT
GLOVE SURG ENC MOIS LTX SZ7.5 (GLOVE) ×2 IMPLANT
GOWN STRL REUS W/TWL LRG LVL3 (GOWN DISPOSABLE) ×2 IMPLANT
GUIDEWIRE ANG ZIPWIRE 038X150 (WIRE) ×4 IMPLANT
GUIDEWIRE STR DUAL SENSOR (WIRE) ×4 IMPLANT
IV NS 1000ML (IV SOLUTION) ×2
IV NS 1000ML BAXH (IV SOLUTION) ×1 IMPLANT
IV NS IRRIG 3000ML ARTHROMATIC (IV SOLUTION) ×2 IMPLANT
KIT TURNOVER CYSTO (KITS) ×2 IMPLANT
MANIFOLD NEPTUNE II (INSTRUMENTS) ×2 IMPLANT
NS IRRIG 500ML POUR BTL (IV SOLUTION) ×2 IMPLANT
PACK CYSTO (CUSTOM PROCEDURE TRAY) ×2 IMPLANT
SHEATH URETERAL 12FRX28CM (UROLOGICAL SUPPLIES) ×2 IMPLANT
STENT POLARIS 5FRX24X.038 (STENTS) ×4 IMPLANT
SYR 10ML LL (SYRINGE) ×2 IMPLANT
SYR 20ML LL LF (SYRINGE) ×2 IMPLANT
TRACTIP FLEXIVA PULS ID 200XHI (Laser) ×1 IMPLANT
TRACTIP FLEXIVA PULSE ID 200 (Laser) ×2
TUBE CONNECTING 12X1/4 (SUCTIONS) ×2 IMPLANT
TUBE FEEDING 8FR 16IN STR KANG (MISCELLANEOUS) ×2 IMPLANT
TUBING UROLOGY SET (TUBING) ×2 IMPLANT

## 2021-04-04 NOTE — Anesthesia Postprocedure Evaluation (Signed)
Anesthesia Post Note  Patient: Felicia Ayala  Procedure(s) Performed: CYSTOSCOPY WITH RETROGRADE PYELOGRAM, URETEROSCOPY AND STENT EXCHANGE (Bilateral: Ureter) HOLMIUM LASER APPLICATION (Bilateral: Ureter)     Patient location during evaluation: PACU Anesthesia Type: General Level of consciousness: awake and alert, awake and oriented Pain management: pain level controlled Vital Signs Assessment: post-procedure vital signs reviewed and stable Respiratory status: spontaneous breathing, nonlabored ventilation and respiratory function stable Cardiovascular status: blood pressure returned to baseline and stable Postop Assessment: no apparent nausea or vomiting Anesthetic complications: no   No notable events documented.  Last Vitals:  Vitals:   04/04/21 1415 04/04/21 1430  BP: 129/64 126/67  Pulse: 80 81  Resp: 11 15  Temp:    SpO2: 99% 98%    Last Pain:  Vitals:   04/04/21 1430  TempSrc:   PainSc: 0-No pain                 Catalina Gravel

## 2021-04-04 NOTE — H&P (Signed)
Felicia Ayala is an 65 y.o. female.    Chief Complaint: Pre-Op BILATERAL Ureteroscopic Stone Manipulation  HPI:   1 - Rt Ureteral + Bilateral Renal Stones - h/o recurrent stones. Rt distal ureteral conglomerate of stones, largest 30m with hydro + bilateral scattered renal stones (abtou 1cm volume each kidney) by ER CT x 2 03/2021. Temporized with bilateral ureteral stent placemnt 03/22/21.    2 - Urinary Tract Infection - fevers, malaise and scant bacteruria on UA x few days at ER presentation 03/2021. No sig nificant leukocytosis. UCX non-clonal and resolved on empiric rocephin bridged to keflex.    PMH sig for migraine, IBS, no ischemic CV disease / blood thinners.   Today "Felicia Ayala is seen for bilateral ureteroscopic stone manipulation with goal of stone free. No interval fevers. She has been on keflex pre-op as instructed.     Past Medical History:  Diagnosis Date  . Anxiety 04/01/2021   oa  . Arthritis   . Chronic constipation   . Closed fracture of right distal radius    3 yrs ago per pt 04-01-2021  . COVID 09/2019   sinus symptoms x 4 to 5 days, still cannot smell well  . Depression   . GERD (gastroesophageal reflux disease)   . Headache   . History of kidney stones   . Hypertension   . IBS (irritable bowel syndrome)   . Osteopenia   . Wears glasses 04/01/2021    Past Surgical History:  Procedure Laterality Date  . CHOLECYSTECTOMY     2 yrs ago per pt on 04-01-2021, laparoscopic  . COLONOSCOPY  06/20/2010   Minimal melanosis. Redundant colon. Otherwise normal colonoscopy.   . colonscopy     couple of yrs ago per pt on 04-01-2021 dr gLyndel Safe . CYSTOSCOPY W/ URETERAL STENT PLACEMENT Bilateral 03/22/2021   Procedure: CYSTOSCOPY WITH RETROGRADE PYELOGRAM/URETERAL STENT PLACEMENT;  Surgeon: MAlexis Frock MD;  Location: [facility];  Service: Urology;  Laterality: Bilateral;  . ESOPHAGOGASTRODUODENOSCOPY  03/24/2013   Mild gastritis. Otherwise normal EGD.  .Marland KitchenOPEN REDUCTION INTERNAL  FIXATION (ORIF) DISTAL RADIAL FRACTURE Right 04/14/2018   Procedure: OPEN REDUCTION INTERNAL FIXATION (ORIF) RIGHT DISTAL RADIUS;  Surgeon: KLeanora Cover MD;  [facility]  Service: Orthopedics;  Laterality: Right;  . WRIST FRACTURE SURGERY Left 01/2001    Family History  Problem Relation Age of Onset  . Diabetes Mother   . Heart disease Mother   . Heart disease Father    Social History:  reports that she has never smoked. She has never used smokeless tobacco. She reports that she does not drink alcohol and does not use drugs.  Allergies: No Known Allergies  No medications prior to admission.    No results found for this or any previous visit (from the past 48 hour(s)). No results found.  Review of Systems  Constitutional:  Negative for chills and fever.  Genitourinary:  Positive for urgency.  All other systems reviewed and are negative.  There were no vitals taken for this visit. Physical Exam Vitals reviewed.  HENT:     Head: Normocephalic.     Nose: Nose normal.     Mouth/Throat:     Mouth: Mucous membranes are moist.  Eyes:     Pupils: Pupils are equal, round, and reactive to light.  Cardiovascular:     Rate and Rhythm: Normal rate.  Pulmonary:     Effort: Pulmonary effort is normal.  Abdominal:     General: Abdomen  is flat.  Genitourinary:    Comments: No CVAT at present.  Musculoskeletal:        General: Normal range of motion.     Cervical back: Normal range of motion.  Skin:    General: Skin is warm.  Neurological:     General: No focal deficit present.     Mental Status: She is alert.  Psychiatric:        Mood and Affect: Mood normal.     Assessment/Plan  Proceed as planned with BILATERAL ureteroscopic stone manipulation. RIsks, benefits, alternatievs, expected peri-op course discussed previously and reiterated today.   Alexis Frock, MD 04/04/2021, 7:38 AM

## 2021-04-04 NOTE — Anesthesia Preprocedure Evaluation (Signed)
Anesthesia Evaluation  Patient identified by MRN, date of birth, ID band Patient awake    Reviewed: Allergy & Precautions, NPO status , Patient's Chart, lab work & pertinent test results  Airway Mallampati: III  TM Distance: >3 FB Neck ROM: Full    Dental  (+) Teeth Intact, Dental Advisory Given   Pulmonary neg pulmonary ROS,    Pulmonary exam normal breath sounds clear to auscultation       Cardiovascular hypertension, Pt. on medications Normal cardiovascular exam Rhythm:Regular Rate:Normal     Neuro/Psych  Headaches, PSYCHIATRIC DISORDERS Anxiety Depression    GI/Hepatic Neg liver ROS, GERD  Medicated,  Endo/Other  negative endocrine ROS  Renal/GU Renal disease (BILATERAL RENAL AND URETERAL STONES)     Musculoskeletal  (+) Arthritis ,   Abdominal   Peds  Hematology negative hematology ROS (+)   Anesthesia Other Findings Day of surgery medications reviewed with the patient.  Reproductive/Obstetrics                             Anesthesia Physical Anesthesia Plan  ASA: 2  Anesthesia Plan: General   Post-op Pain Management:    Induction: Intravenous  PONV Risk Score and Plan: 4 or greater and Midazolam, Dexamethasone and Ondansetron  Airway Management Planned: LMA  Additional Equipment:   Intra-op Plan:   Post-operative Plan: Extubation in OR  Informed Consent: I have reviewed the patients History and Physical, chart, labs and discussed the procedure including the risks, benefits and alternatives for the proposed anesthesia with the patient or authorized representative who has indicated his/her understanding and acceptance.     Dental advisory given  Plan Discussed with: CRNA  Anesthesia Plan Comments:         Anesthesia Quick Evaluation

## 2021-04-04 NOTE — Anesthesia Procedure Notes (Signed)
Procedure Name: LMA Insertion Date/Time: 04/04/2021 12:44 PM Performed by: Suan Halter, CRNA Pre-anesthesia Checklist: Patient identified, Emergency Drugs available, Suction available and Patient being monitored Patient Re-evaluated:Patient Re-evaluated prior to induction Oxygen Delivery Method: Circle system utilized Preoxygenation: Pre-oxygenation with 100% oxygen Induction Type: IV induction Ventilation: Mask ventilation without difficulty LMA: LMA inserted LMA Size: 4.0 Number of attempts: 1 Airway Equipment and Method: Bite block Placement Confirmation: positive ETCO2 Tube secured with: Tape Dental Injury: Teeth and Oropharynx as per pre-operative assessment

## 2021-04-04 NOTE — Discharge Instructions (Addendum)
1 - You may have urinary urgency (bladder spasms) and bloody urine on / off with stent in place. This is normal.  2 - Remove tethered stents on Tuesday morning at home by pulling on strings, then blue-white plastic tubing, and discarding. There are TWO stents. Office is open Tuesday if any problems arise.   3 - Call MD or go to ER for fever >102, severe pain / nausea / vomiting not relieved by medications, or acute change in medical status   [facility] [phone] Post Ureteroscopy With Stent Instructions Definitions:  Ureter: The duct that transports urine from the kidney to the bladder. Stent:   A plastic hollow tube that is placed into the ureter, from the kidney to the bladder to prevent the ureter from swelling shut.  GENERAL INSTRUCTIONS:  Despite the fact that no skin incisions were used, the area around the ureter and bladder is raw and irritated. The stent is a foreign body which will further irritate the bladder wall. This irritation is manifested by increased frequency of urination, both day and night, and by an increase in the urge to urinate. In some, the urge to urinate is present almost always. Sometimes the urge is strong enough that you may not be able to stop yourself from urinating. The only real cure is to remove the stent and then give time for the bladder wall to heal which can't be done until the danger of the ureter swelling shut has passed, which varies.  You may see some blood in your urine while the stent is in place and a few days afterwards. Do not be alarmed, even if the urine was clear for a while. Get off your feet and drink lots of fluids until clearing occurs. If you start to pass clots or don't improve, call us.  DIET: You may return to your normal diet immediately. Because of the raw surface of your bladder, alcohol, spicy foods, acid type foods and drinks with caffeine may cause irritation or frequency and should be used in moderation. To  keep your urine flowing freely and to avoid constipation, drink plenty of fluids during the day ( 8-10 glasses ). Tip: Avoid cranberry juice because it is very acidic.  ACTIVITY: Your physical activity doesn't need to be restricted. However, if you are very active, you may see some blood in your urine. We suggest that you reduce your activity under these circumstances until the bleeding has stopped.  BOWELS: It is important to keep your bowels regular during the postoperative period. Straining with bowel movements can cause bleeding. A bowel movement every other day is reasonable. Use a mild laxative if needed, such as Milk of Magnesia 2-3 tablespoons, or 2 Dulcolax tablets. Call if you continue to have problems. If you have been taking narcotics for pain, before, during or after your surgery, you may be constipated. Take a laxative if necessary.   MEDICATION: You should resume your pre-surgery medications unless told not to. In addition you will often be given an antibiotic to prevent infection. These should be taken as prescribed until the bottles are finished unless you are having an unusual reaction to one of the drugs.  PROBLEMS YOU SHOULD REPORT TO [place]: Fevers over 100.5 Fahrenheit. Heavy bleeding, or clots ( See above notes about blood in urine ). Inability to urinate. Drug reactions ( hives, rash, nausea, vomiting, diarrhea ). Severe burning or pain with urination that is not improving.  FOLLOW-UP: You will need a follow-up appointment to  monitor your progress. Call for this appointment at the number listed above. Usually the first appointment will be about three to fourteen days after your surgery.    Post Anesthesia Home Care Instructions  Activity: Get plenty of rest for the remainder of the day. A responsible individual must stay with you for 24 hours following the procedure.  For the next 24 hours, DO NOT: -Drive a car -Paediatric nurse -Drink alcoholic beverages -Take  any medication unless instructed by your physician -Make any legal decisions or sign important papers.  Meals: Start with liquid foods such as gelatin or soup. Progress to regular foods as tolerated. Avoid greasy, spicy, heavy foods. If nausea and/or vomiting occur, drink only clear liquids until the nausea and/or vomiting subsides. Call your physician if vomiting continues.  Special Instructions/Symptoms: Your throat may feel dry or sore from the anesthesia or the breathing tube placed in your throat during surgery. If this causes discomfort, gargle with warm salt water. The discomfort should disappear within 24 hours.      No ibuprofen, Advil, Aleve, Motrin, ketorolac, Toradol, meloxicam, Mobic, or naproxen until after 7:45 pm today if needed.

## 2021-04-04 NOTE — Brief Op Note (Signed)
04/04/2021  1:39 PM  PATIENT:  Felicia Ayala  65 y.o. female  PRE-OPERATIVE DIAGNOSIS:  BILATERAL RENAL AND URETERAL STONES  POST-OPERATIVE DIAGNOSIS:  BILATERAL RENAL AND URETERAL STONES  PROCEDURE:  Procedure(s): CYSTOSCOPY WITH RETROGRADE PYELOGRAM, URETEROSCOPY AND STENT EXCHANGE (Bilateral) HOLMIUM LASER APPLICATION (Bilateral)  SURGEON:  Surgeon(s) and Role:    Alexis Frock, MD - Primary  PHYSICIAN ASSISTANT:   ASSISTANTS: none   ANESTHESIA:   general  EBL:  minimal   BLOOD ADMINISTERED:none  DRAINS: none   LOCAL MEDICATIONS USED:  NONE  SPECIMEN:  Source of Specimen:  bilateral renal / ureteral stone fragments  DISPOSITION OF SPECIMEN:   [facility] for compositional analysis  COUNTS:  YES  TOURNIQUET:  * No tourniquets in log *  DICTATION: .Other Dictation: Dictation Number [id number]  PLAN OF CARE: Discharge to home after PACU  PATIENT DISPOSITION:  PACU - hemodynamically stable.   Delay start of Pharmacological VTE agent (>24hrs) due to surgical blood loss or risk of bleeding: not applicable

## 2021-04-04 NOTE — Transfer of Care (Signed)
Immediate Anesthesia Transfer of Care Note  Patient: Orlinda TAKAIYA SMOLEN  Procedure(s) Performed: Procedure(s) (LRB): CYSTOSCOPY WITH RETROGRADE PYELOGRAM, URETEROSCOPY AND STENT EXCHANGE (Bilateral) HOLMIUM LASER APPLICATION (Bilateral)  Patient Location: PACU  Anesthesia Type: General  Level of Consciousness: awake, oriented, sedated and patient cooperative  Airway & Oxygen Therapy: Patient Spontanous Breathing and Patient connected to face mask oxygen  Post-op Assessment: Report given to PACU RN and Post -op Vital signs reviewed and stable  Post vital signs: Reviewed and stable  Complications: No apparent anesthesia complications Last Vitals:  Vitals Value Taken Time  BP 139/72 04/04/21 1353  Temp    Pulse 85 04/04/21 1357  Resp 14 04/04/21 1357  SpO2 97 % 04/04/21 1357  Vitals shown include unvalidated device data.  Last Pain:  Vitals:   04/04/21 1140  TempSrc: Oral  PainSc: 4          Complications: No notable events documented.

## 2021-04-08 NOTE — Addendum Note (Signed)
Addendum  created 04/08/21 1137 by Bonney Aid, CRNA   Charge Capture section accepted

## 2021-04-08 NOTE — Op Note (Signed)
Felicia Ayala, RHODD MEDICAL RECORD NO: [id number] ACCOUNT NO: [id number] DATE OF BIRTH: 1955/08/28 FACILITY: [facility] LOCATION: [facility] PHYSICIAN: Alexis Frock, MD  Operative Report   DATE OF PROCEDURE: 04/04/2021  PREOPERATIVE DIAGNOSES:  Right ureteral, bilateral renal stones, history of urinary tract infection.  PROCEDURE PERFORMED:  1.  Cystoscopy, bilateral retrograde pyelograms interpretation. 2.  Bilateral ureteroscopy with laser lithotripsy. 3.  Exchange of bilateral ureteral stents, 5 x 24 Polaris with tether.  ESTIMATED BLOOD LOSS:  Nil.  COMPLICATIONS:  None.  SPECIMEN:  Bilateral renal and ureteral stone fragments for composition analysis.  FINDINGS: 1.  Impacted right distal ureteral stone x2. 2.  Bilateral intrarenal stones. 3.  Complete resolution of all accessible stone fragments larger than one-third mm following laser lithotripsy and basket extraction. 4.  Most renal stone volume parenchymal appearing. 5.  Successful exchange of bilateral ureteral stents, proximal end in renal pelvis and distal end in urinary bladder with tethers.  INDICATIONS:  The patient is a pleasant 65 year old lady with history of prior urolithiasis.  She was found on workup of colicky flank pain and fevers to have multifocal right distal ureteral stones, bilateral renal stones and overall picture concerning  for impending urosepsis last month.  She underwent urgent stenting for renal decompression and temporizing and she cleared infectious parameters on antibiotics.  She now presents for definitive stone management with ureteroscopy.  Informed consent was  obtained and placed in medical record.  DESCRIPTION OF PROCEDURE:  The patient being verified, procedure being bilateral ureteroscopic stone manipulation was confirmed. Procedure timeout was performed.  Intravenous antibiotics were administered and general LMA anesthesia induced.  The patient  was placed into a low lithotomy position.   Sterile field was created, prepped and draped the patient's vagina, introitus and proximal thigh using iodine.  Cystourethroscopy was performed using 21-French rigid cystoscope with offset lens.  Inspection of  the urinary bladder revealed distal end of bilateral ureteral stents.  The distal end of the right stent was grasped, brought to the level of the urethral meatus.  A 0.038 ZIPwire was advanced to the level of upper pole. Stent was exchanged for an  open-ended catheter and right retrograde pyelogram was obtained.  Right retrograde pyelogram demonstrated a single right ureter, single system right kidney, questionable filling defect in distal ureter consistent with known stone.  ZIPwire was once again advanced, set aside as a safety wire.  Next, distal end of the  left stent was grasped, brought to the level of the urethral meatus.  A 0.038 ZIPwire was advanced.  Stent was exchanged for an open-ended catheter and left retrograde pyelogram was obtained.  Left retrograde pyelogram demonstrated single left ureter, single system left kidney.  No filling defects, narrowing or hydronephrosis noted.  A separate ZIPwire was once again advanced on the left side, set aside as a safety wire.  The 8-French feeding  tube placed in the urinary bladder for pressure release.  Semirigid ureteroscopy was performed, distal four-fifths of left ureter alongside a separate sensor working wire.  No mucosal abnormalities were found.  Next, semirigid ureteroscopy was performed  of distal right ureter alongside a separate sensor working wire. As anticipated, there were multifocal ureteral stones on the right side.  These were fairly ovoid and were amenable to basketing with the Escape basket, having positioned them on their long  axis, removing them sequentially and setting aside for composition analysis and semirigid ureteroscopy performed of distal four-fifths of the right ureter and no additional mucosal abnormalities or  calcifications were noted.  The semirigid scope was  then exchanged for a 12/14 short length ureteral access sheath at the level of the proximal ureter using continuous fluoroscopic guidance and flexible digital ureteroscopy was performed of the proximal right ureter and systematic inspection of the right  kidney, including all calices x3.  There was significant amount of clot material in the renal pelvis, which did obscure visualization some.  There were multifocal papillary tip calcifications that were mostly parenchymal and Randall's plaques appearing.  Holmium laser energy applied to these areas, thus ablating all visible stone material; however, it was clearly felt that given the parenchymal nature, likely some parenchymal stone clearly was still present, but none of which was in the renal pelvis or  free floating.  Access sheath was removed under continuous vision.  No significant mucosal abnormalities were found.  Next, access sheath was placed over the left central working wire to the level of the proximal left ureter and flexible digital  ureteroscopy was performed of the proximal left ureter and systematic inspection of the left kidney including all calices x3.  There were multifocal papillary tip calcifications.  Again, most of these are Randall's plaque in nature.  There was a dominant  calcification, mid pole and lower pole.  The lower pole calcifications were amenable to simple basketing, removed and set aside.  The mid pole calcification was very, very adherent to the calix and papilla and multiple attempts were made to reposition  this with basketing; however, it was so adherent, this was not possible.  As such, holmium laser energy applied to the stone using settings of 1 joule and 10 Hz and dusting technique was used to ablate all accessible stone in this location.  Following  this complete resolution of all accessible stone fragments larger than one-third mm, access sheath was removed  under continuous vision.  No significant mucosal abnormalities were found.  Given the bilateral nature of the procedure today, it was felt that  brief interval stenting with tethered stents would be most prudent.  As such, new 5 x 24 Polaris type stents were placed over the remaining safety wires bilaterally using fluoroscopic guidance.  Good proximal and distal plane were noted.  Tethers left  in place, fashioned together, tucked per vagina and the procedure was terminated.  The patient tolerated the procedure well.  No immediate perioperative complications.  The patient was taken to postanesthesia care in stable condition.  Plan for discharge  home.   SHW D: 04/04/2021 1:47:00 pm T: 04/05/2021 1:43:00 am  JOB: [id number]

## 2021-04-09 ENCOUNTER — Encounter (HOSPITAL_BASED_OUTPATIENT_CLINIC_OR_DEPARTMENT_OTHER): Payer: Self-pay | Admitting: Urology

## 2021-11-14 ENCOUNTER — Encounter: Payer: Self-pay | Admitting: Gastroenterology

## 2021-11-14 ENCOUNTER — Ambulatory Visit (INDEPENDENT_AMBULATORY_CARE_PROVIDER_SITE_OTHER): Payer: BC Managed Care – PPO | Admitting: Gastroenterology

## 2021-11-14 VITALS — BP 130/78 | HR 75 | Ht 65.0 in | Wt 155.1 lb

## 2021-11-14 DIAGNOSIS — K581 Irritable bowel syndrome with constipation: Secondary | ICD-10-CM | POA: Diagnosis not present

## 2021-11-14 DIAGNOSIS — R1319 Other dysphagia: Secondary | ICD-10-CM | POA: Diagnosis not present

## 2021-11-14 DIAGNOSIS — K219 Gastro-esophageal reflux disease without esophagitis: Secondary | ICD-10-CM

## 2021-11-14 MED ORDER — ESOMEPRAZOLE MAGNESIUM 40 MG PO CPDR: 40.0000 mg | DELAYED_RELEASE_CAPSULE | Freq: Every day | ORAL | 4 refills | Status: DC

## 2021-11-14 NOTE — Patient Instructions (Addendum)
If you are age 66 or older, your body mass index should be between 23-30. Your Body mass index is 25.81 kg/m?Marland Kitchen If this is out of the aforementioned range listed, please consider follow up with your Primary Care Provider. ? ?If you are age 33 or younger, your body mass index should be between 19-25. Your Body mass index is 25.81 kg/m?Marland Kitchen If this is out of the aformentioned range listed, please consider follow up with your Primary Care Provider.  ? ?________________________________________________________ ? ?The [facility] providers would like to encourage you to use [facility] to communicate with providers for non-urgent requests or questions.  Due to long hold times on the telephone, sending your provider a message by [facility] may be a faster and more efficient way to get a response.  Please allow 48 business hours for a response.  Please remember that this is for non-urgent requests.  ?_______________________________________________________ ? ?We have sent the following medications to your pharmacy for you to pick up at your convenience: ?Nexium ? ?Please purchase the following medications over the counter and take as directed: ?Miralax 17g every day in 8 oz of water ? ?Continue Linzess. ? ?Increase water intake. ? ?Call in 4 weeks with any update to the nurse on how you are doing. ? ?Call us in 6 months to schedule a follow up appointment. ? ?Thank you, ? ?Dr. Jackquline Denmark ? ? ? ?We want to thank you for trusting [facility] with your care. All of our staff and providers value the relationships we have built with our patients, and it is an honor to care for you.  ? ?We are writing to let you know that [facility] will close on Dec 15, 2021, and we invite you to continue to see Dr. Carmell Austria and Gerrit Heck at the [facility] [place] office location. We are consolidating our serices at these [facility] practices to better provide care. Our office staff will  work with you to ensure a seamless transition.  ? ?Gerrit Heck, DO -Dr. Bryan Lemma will be movig to [facility] at [address], [place], [place] [address], effective Dec 15, 2021.  Contact [phone] to schedule an appointment with him.  ? ?Carmell Austria, MD- Dr. Lyndel Safe will be movig to [facility] at [address] [place], [place] [address], effective Dec 15, 2021.  Contact [phone] to schedule an appointment with him.  ? ?Requesting Medical Records ?If you need to request your medical records, please follow the instructions below. Your medical records are confidential, and a copy can be transferred to another provider or released to you or another person you designate only with your permission. ? ?There are several ways to request your medical records: ?Requests for medical records can be submitted through our practice.   ?You can also request your records electronically, in your MyChart account by selecting the ?Request Health Records? tab.  ?If you need additional information on how to request records, please go to [email or web address]/, choose Patient Information, then select Request Medical Records. ?To make an appointment or if you have any questions about your health care needs, please contact our office at [phone] and one of our staff members will be glad to assist you. ?[facility] is committed to providing exceptional care for you and our community. Thank you for allowing [place] to serve your health care needs. ?Sincerely, ? ?Windy Canny, Director [facility] ?[facility] also offers convenient virtual care options. Sore throat? Sinus problems?  Cold or flu symptoms? Get care from the comfort of home with [facility] Video Visits and e-Visits. Learn more about the non-emergency conditions treated and start your virtual visit at [email or web address] ? ? ?

## 2021-11-14 NOTE — Progress Notes (Signed)
? ? ?Chief Complaint:  ? ?Referring Provider:  Dr Bea Graff    ? ? ?ASSESSMENT AND PLAN;  ? ?#1. IBS-C and abdo bloating and LUQ abdo discomfort. Net noncontrast CT 03/2021 ? ?#2.  GERD ? ? ?Plan: ? ?- Change omeprazole to nexium '40mg'$  po QD ?- Add Miralax 17g po QD  ?- linzess 26mg PO qd to continue. ?- If still with problems, proceed with CT scan abdo/pelvis with PO/IV contrast (will need CBC, CMP prior) ?- Increase water intake. ?- Minimize pain medications as she is already been doing. ?- FU in 6 months ?- Call in 4 weeks ? ? ? ? ?HPI:   ? ?Felicia MLEEAH POLITANOis a 66y.o. female  ? ?For follow-up visit ? ?Recently had kidney stones requiring cystoscopy.  Most recent BUN/creatinine were normal.  She has not used any narcotics since. ? ?From the GI standpoint doing better.  No further dysphagia since dilation.  She does have breakthrough symptoms with omeprazole 20 mg p.o. twice daily.  No melena or hematochezia.  She has cut down and stopped nonsteroidals. ? ?Still with abdominal bloating, left upper quadrant abdominal discomfort with constipation.  More bloating and abdominal discomfort when she gets up in the morning.  It continues throughout the day.  The constipation is better with Linzess 2952m. No fever chills or night sweats.  No recent weight loss. ? ? ? ?From previous notes: ?-Longstanding history of Contipation x over 20 years, has been getting worse.  Most recently Linzess has been increased to 290 mcg p.o. once a day with some relief.  Has significant associated abdominal bloating, passage of pellet-like stools.  ? ? ?SH ?- daughter with IBS withdiarrhea ? ?Past GI procedures: ? ?Colonoscopy 03/2019 ?- One 4 mm polyp in the cecum s/p polypectomy. Bx-tubular adenoma ?- Non-bleeding internal hemorrhoids. ?- Otherwise normal colonoscopy to TI. The colon was somewhat redundant. ?- Rpt in 7 yrs. earlier, if with any new problems. ? ?EGD with Dil 03/2019 ?-Mild gastritis. ?-Incidental gastric polyps (s/p  polypectomy x 3) ?-S/P empiric esophageal dil 5023r ?-Neg Bx for EoE, neg SB Bx for celiac, negative H. Pylori ? ?NCCT renal stone protocol 03/2021 ?1. Multiple stones demonstrated in the distal right ureter, ?increasing since prior study. Largest measures 7 mm. Moderate ?proximal obstruction. ?2. Multiple bilateral nonobstructing intrarenal stones again ?demonstrated. ? ?-Colonoscopy ([facility]) 06/2010-redundant colon, mild melanosis coli. ?-EGD 03/2013 moderate gastritis, S/P dil 50 Fr with good relief. Neg SB bx for celiac. ?-CT abdo/pelvis without contrast-minimal right-sided hydronephrosis, bilateral nonobstructing renal stones, mild DJD lower spine. ?Past Medical History:  ?Diagnosis Date  ? Anxiety 04/01/2021  ? oa  ? Arthritis   ? Chronic constipation   ? Closed fracture of right distal radius   ? 3 yrs ago per pt 04-01-2021  ? COVID 09/2019  ? sinus symptoms x 4 to 5 days, still cannot smell well  ? Depression   ? GERD (gastroesophageal reflux disease)   ? Headache   ? History of kidney stones   ? Hypertension   ? IBS (irritable bowel syndrome)   ? Osteopenia   ? Wears glasses 04/01/2021  ? ? ?Past Surgical History:  ?Procedure Laterality Date  ? CHOLECYSTECTOMY    ? 2 yrs ago per pt on 04-01-2021, laparoscopic  ? COLONOSCOPY  06/20/2010  ? Minimal melanosis. Redundant colon. Otherwise normal colonoscopy.   ? colonscopy    ? couple of yrs ago per pt on 04-01-2021 dr guLyndel Safe? CYSTOSCOPY W/  URETERAL STENT PLACEMENT Bilateral 03/22/2021  ? Procedure: CYSTOSCOPY WITH RETROGRADE PYELOGRAM/URETERAL STENT PLACEMENT;  Surgeon: Alexis Frock, MD;  Location: [facility];  Service: Urology;  Laterality: Bilateral;  ? CYSTOSCOPY WITH RETROGRADE PYELOGRAM, URETEROSCOPY AND STENT PLACEMENT Bilateral 04/04/2021  ? Procedure: CYSTOSCOPY WITH RETROGRADE PYELOGRAM, URETEROSCOPY AND STENT EXCHANGE;  Surgeon: Alexis Frock, MD;  Location: [facility]  Service: Urology;  Laterality: Bilateral;  ? ESOPHAGOGASTRODUODENOSCOPY   03/24/2013  ? Mild gastritis. Otherwise normal EGD.  ? HOLMIUM LASER APPLICATION Bilateral 12/08/996  ? Procedure: HOLMIUM LASER APPLICATION;  Surgeon: Alexis Frock, MD;  Location: [facility];  Service: Urology;  Laterality: Bilateral;  ? OPEN REDUCTION INTERNAL FIXATION (ORIF) DISTAL RADIAL FRACTURE Right 04/14/2018  ? Procedure: OPEN REDUCTION INTERNAL FIXATION (ORIF) RIGHT DISTAL RADIUS;  Surgeon: Leanora Cover, MD;  Location: [facility];  Service: Orthopedics;  Laterality: Right;  ? WRIST FRACTURE SURGERY Left 01/2001  ? ? ?Family History  ?Problem Relation Age of Onset  ? Diabetes Mother   ? Heart disease Mother   ? Heart disease Father   ? ? ?Social History  ? ?Tobacco Use  ? Smoking status: Never  ? Smokeless tobacco: Never  ?Vaping Use  ? Vaping Use: Never used  ?Substance Use Topics  ? Alcohol use: Never  ? Drug use: Never  ? ? ?Current Outpatient Medications  ?Medication Sig Dispense Refill  ? ADK 5000-5000-500 UNIT-MCG CAPS Take 1 capsule by mouth daily.    ? atorvastatin (LIPITOR) 10 MG tablet Take 10 mg by mouth at bedtime.    ? B Complex-C (B-COMPLEX WITH VITAMIN C) tablet Take 1 tablet by mouth at bedtime.    ? Butalbital-APAP-Caffeine 50-325-40 MG capsule Take 1 capsule by mouth every 4 (four) hours as needed for headache.    ? cholecalciferol (VITAMIN D3) 25 MCG (1000 UNIT) tablet Take 1,000 Units by mouth every evening.    ? DULoxetine (CYMBALTA) 60 MG capsule Take 60 mg by mouth daily.    ? fexofenadine (ALLEGRA) 180 MG tablet Take 180 mg by mouth every evening.    ? fluticasone (FLONASE) 50 MCG/ACT nasal spray Place 1 spray into both nostrils in the morning and at bedtime. 1 SPRAY IN EACH NOSTRIL TWICE DAILY    ? ibuprofen (ADVIL) 200 MG tablet Take 600 mg by mouth every 6 (six) hours as needed.    ? ketorolac (TORADOL) 10 MG tablet Take 10 mg by mouth every 6 (six) hours as needed for moderate pain or severe pain.    ? linaclotide (LINZESS) 290 MCG CAPS capsule  Take 290 mcg by mouth every evening.    ? losartan (COZAAR) 25 MG tablet Take 25 mg by mouth daily.    ? omeprazole (PRILOSEC) 20 MG capsule Take 20 mg by mouth at bedtime.    ? ondansetron (ZOFRAN-ODT) 4 MG disintegrating tablet Take 4 mg by mouth every 8 (eight) hours as needed for nausea or vomiting.    ? OVER THE COUNTER MEDICATION DIM herbs for menopause daily    ? oxyCODONE-acetaminophen (PERCOCET) 5-325 MG tablet 1 tab Q8 PRN breakthrough pain from kidney stone 15 tablet 0  ? PROGESTERONE PO Take by mouth. Not sure of dosage    ? promethazine (PHENERGAN) 25 MG tablet Take 25 mg by mouth every 6 (six) hours as needed for nausea or vomiting.    ? topiramate (TOPAMAX) 50 MG tablet Take 100 mg by mouth at bedtime.    ? Zinc 25 MG TABS Take  1 tablet by mouth daily.    ? ?No current facility-administered medications for this visit.  ? ? ?No Known Allergies ? ?Review of Systems:  ?Psychiatric/Behavioral: Has anxiety or depression ? ?  ? ?Physical Exam:   ? ?BP 130/78   Pulse 75   Ht '5\' 5"'$  (1.651 m)   Wt 155 lb 2 oz (70.4 kg)   SpO2 99%   BMI 25.81 kg/m?  ?Filed Weights  ? 11/14/21 1609  ?Weight: 155 lb 2 oz (70.4 kg)  ? ?Gen: awake, alert, NAD ?HEENT: anicteric, no pallor ?CV: RRR, no mrg ?Pulm: CTA b/l ?Abd: soft, NT/ND, +BS throughout ?Ext: no c/c/e ?Neuro: nonfocal ? ? ? ? ?Carmell Austria, MD 11/14/2021, 4:15 PM ? ?Cc: Dr. Bea Graff ? ? ?

## 2021-11-15 ENCOUNTER — Encounter: Payer: Self-pay | Admitting: Gastroenterology

## 2021-11-17 ENCOUNTER — Other Ambulatory Visit: Payer: Self-pay

## 2021-11-17 NOTE — Telephone Encounter (Signed)
Prescription was called in to [facility]: [phone] per pt request for the Nexium:  ?Verbal Order Given to [employer] Warehouse manager) ?Pt made aware ?

## 2022-09-24 IMAGING — CT CT RENAL STONE PROTOCOL
2 of 4 series · 16 of 46 positions shown, 18 images · non-contrast
Comparison: 03/20/2021

CLINICAL DATA: Flank pain. Kidney stones suspected. Right flank
pain with fever. History of stone.

EXAM:
CT ABDOMEN AND PELVIS WITHOUT CONTRAST
TECHNIQUE: Multidetector CT imaging of the abdomen and pelvis was performed
following the standard protocol without IV contrast.

[Series 2: axial st · axial · 0.78mm/px · z∈[-662,-287]mm · 13 of 87 slices shown, 15 images]
[im 6/87  soft-tissue]
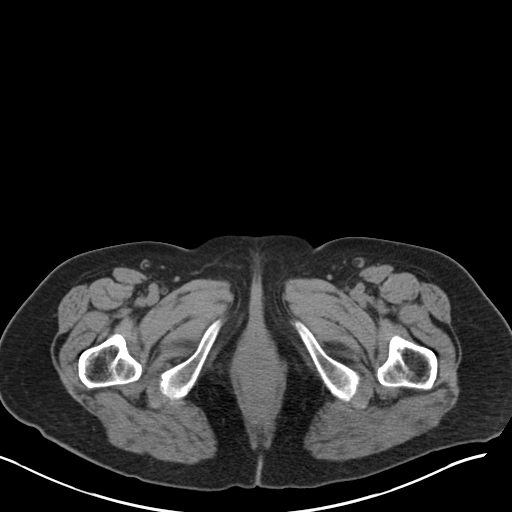
[im 6/87  bone]
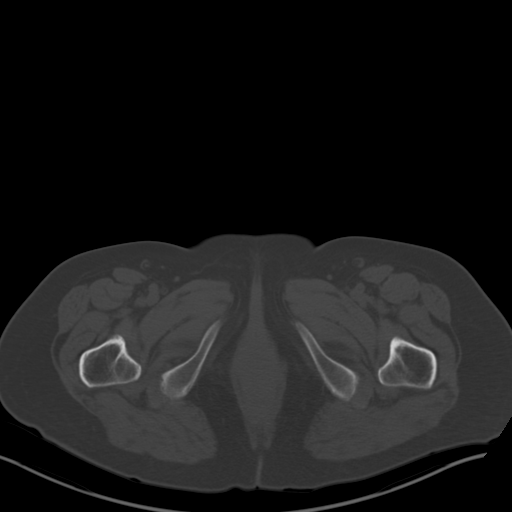
[im 11/87  soft-tissue]
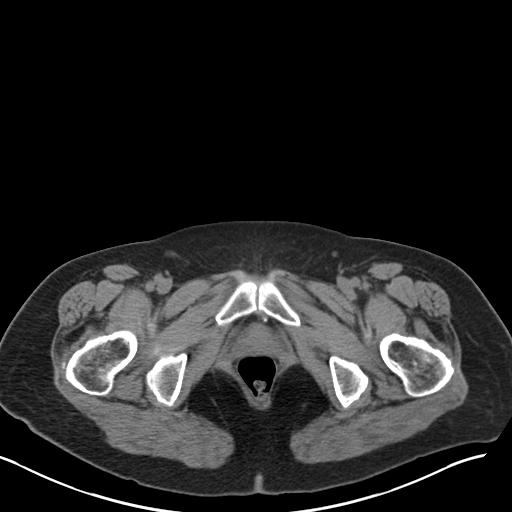
[im 21/87  soft-tissue]
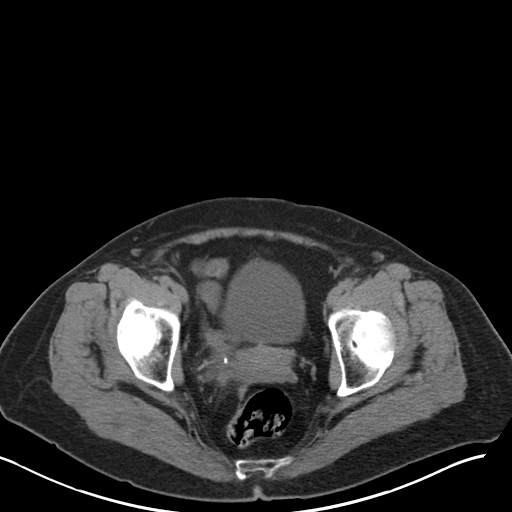
[im 26/87  soft-tissue]
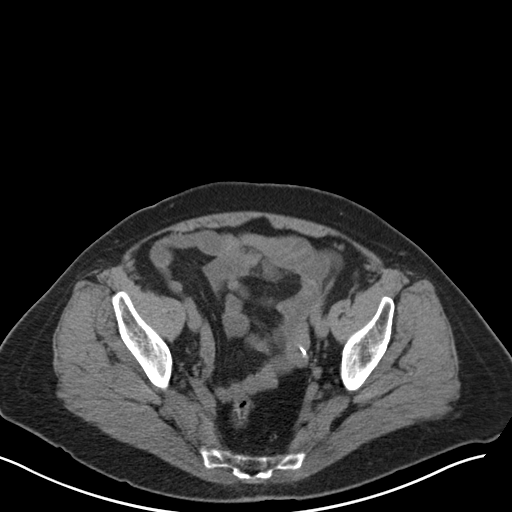
[im 31/87  soft-tissue]
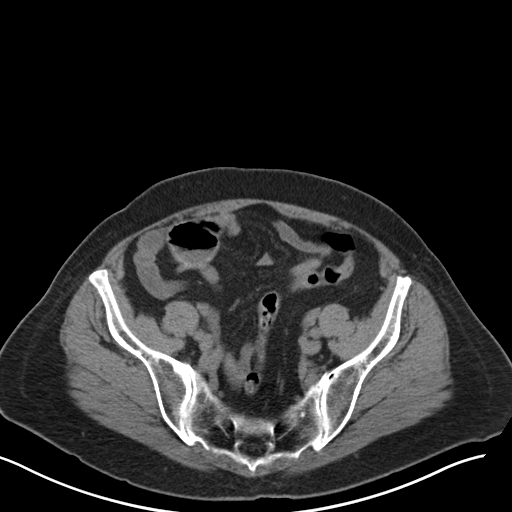
[im 36/87  soft-tissue]
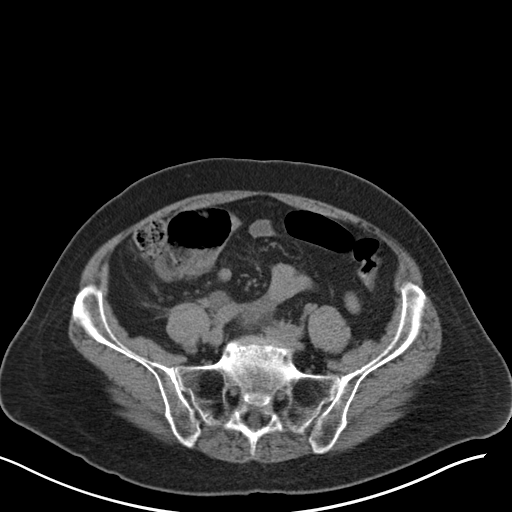
[im 46/87  soft-tissue]
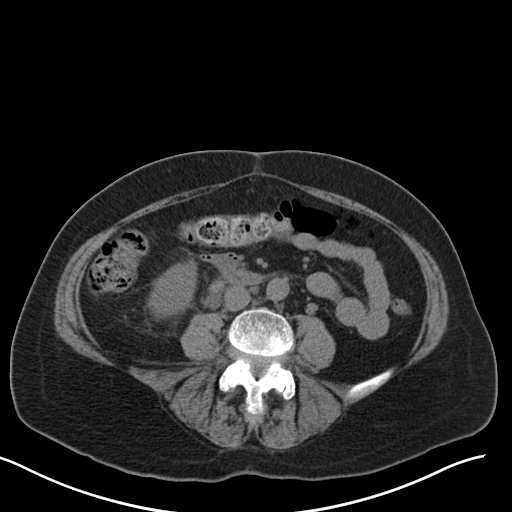
[im 51/87  soft-tissue]
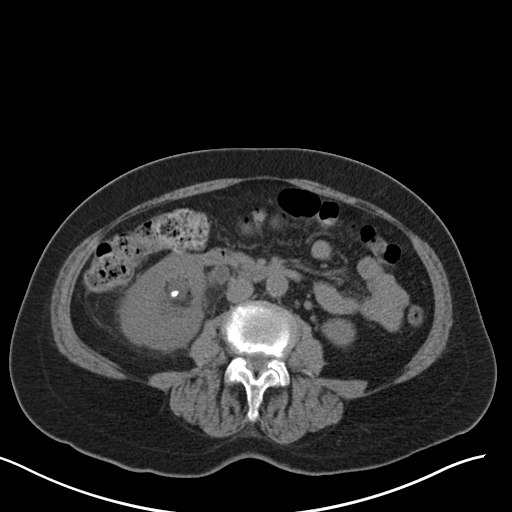
[im 56/87  soft-tissue]
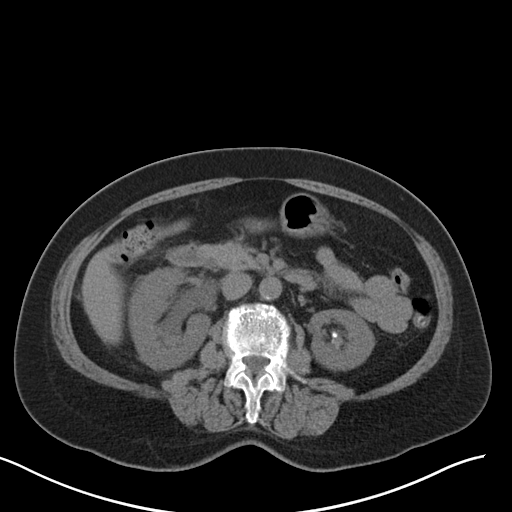
[im 56/87  bone]
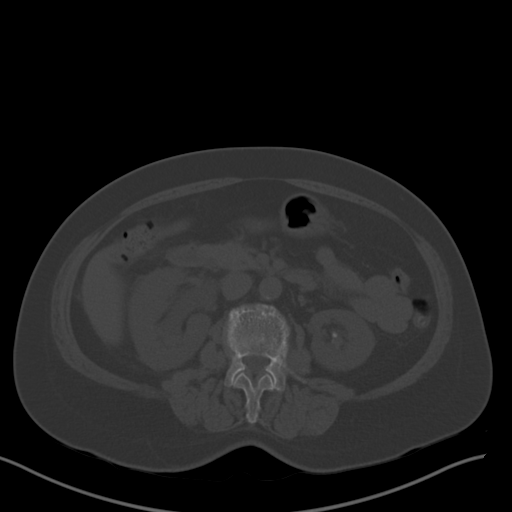
[im 61/87  soft-tissue]
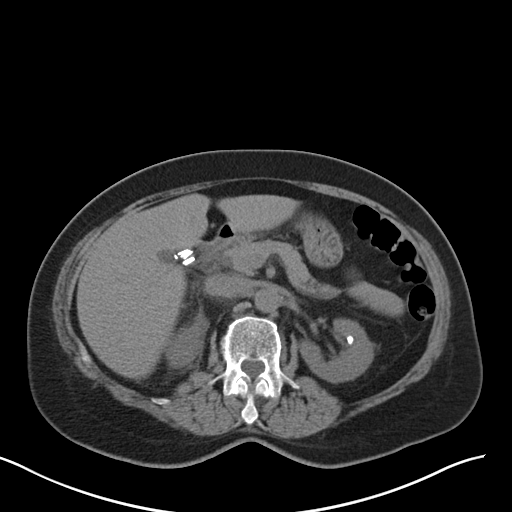
[im 66/87  soft-tissue]
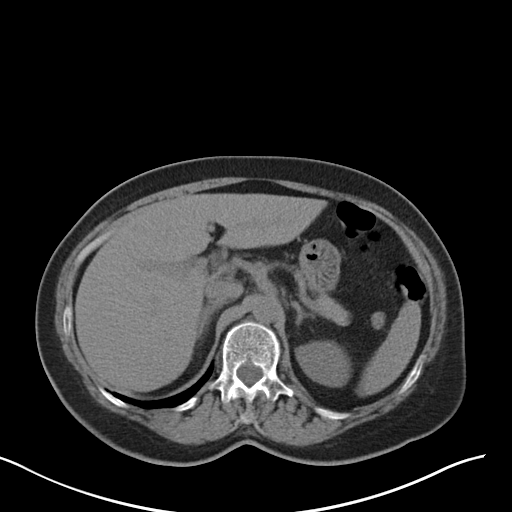
[im 76/87  soft-tissue]
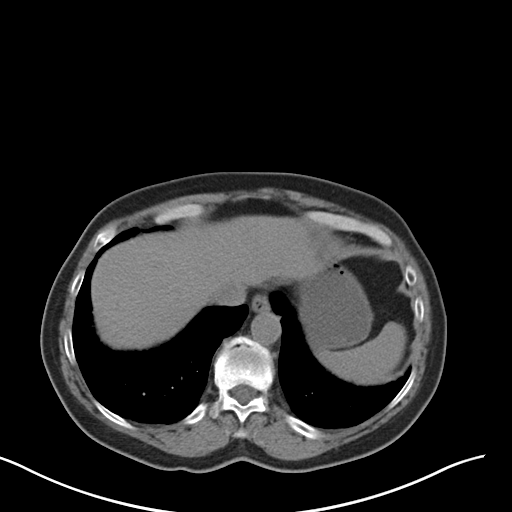
[im 81/87  soft-tissue]
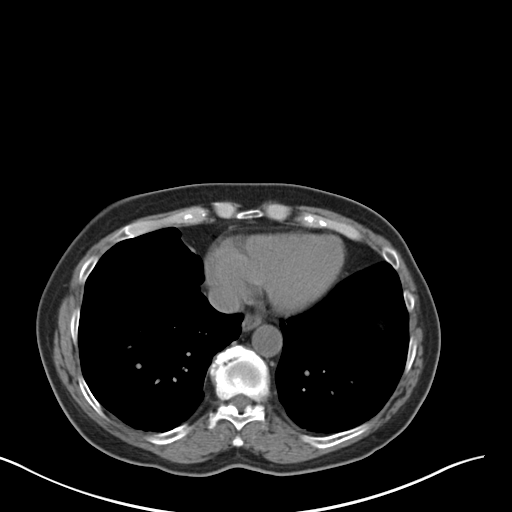

[Series 5: coronal · coronal · 0.72mm/px · 3 of 142 slices shown]
[im 48/142  soft-tissue]
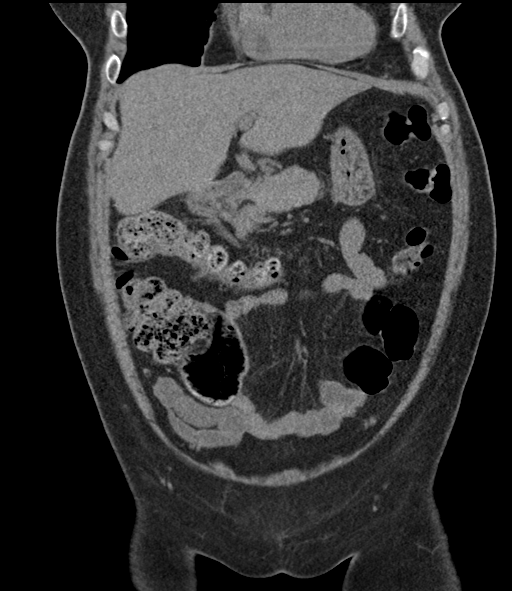
[im 63/142  soft-tissue]
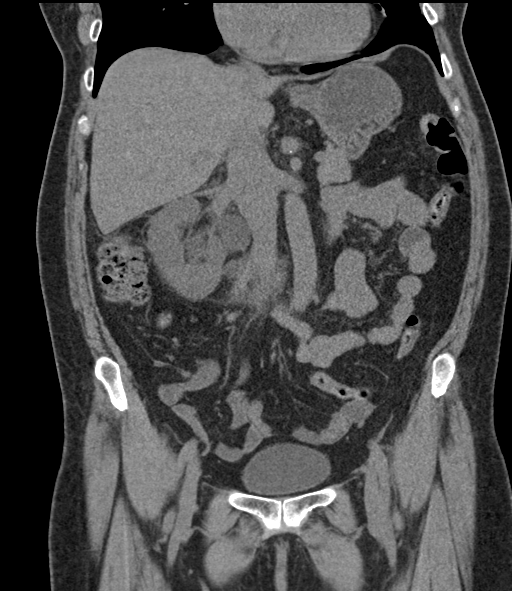
[im 79/142  soft-tissue]
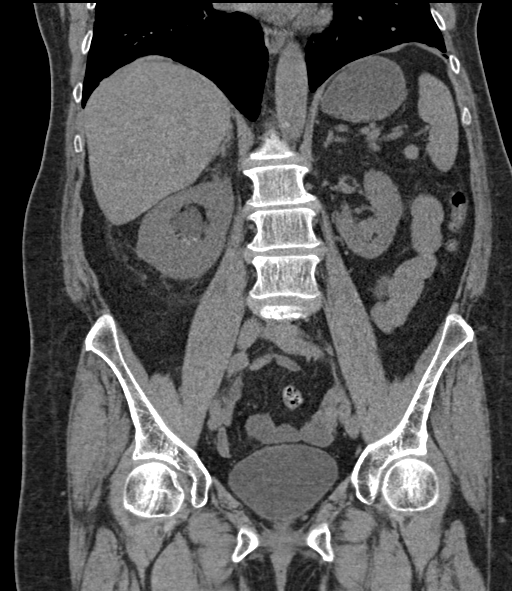

[16 of 46 positions shown; findings below may reference images not displayed]

FINDINGS: Lower chest: Bilateral breast implants. Slight fibrosis in the lung
bases.

Hepatobiliary: No focal liver abnormality is seen. Status post
cholecystectomy. No biliary dilatation.

Pancreas: Unremarkable. No pancreatic ductal dilatation or
surrounding inflammatory changes.

Spleen: Normal in size without focal abnormality.

Adrenals/Urinary Tract: No adrenal gland nodules. Multiple stones
are demonstrated in the distal right ureter above the ureterovesical
junction. Largest measures about 7 mm in maximal diameter. At least
1 of the stones was present on the previous study but new stones are
seen today. There is proximal hydronephrosis and hydroureter with
stranding around the right kidney and ureter. Bilateral intrarenal
stones are again demonstrated. No hydronephrosis on the left.
Bladder is unremarkable.

Stomach/Bowel: Stomach is within normal limits. Appendix appears
normal. No evidence of bowel wall thickening, distention, or
inflammatory changes.

Vascular/Lymphatic: No significant vascular findings are present. No
enlarged abdominal or pelvic lymph nodes.

Reproductive: Uterus and bilateral adnexa are unremarkable.

Other: No abdominal wall hernia or abnormality. No abdominopelvic
ascites.

Musculoskeletal: Slight anterior subluxation at L4-5, likely
degenerative. No vertebral compression deformities.
IMPRESSION: 1. Multiple stones demonstrated in the distal right ureter,
increasing since prior study. Largest measures 7 mm. Moderate
proximal obstruction.
2. Multiple bilateral nonobstructing intrarenal stones again
demonstrated.

## 2022-12-17 ENCOUNTER — Other Ambulatory Visit: Payer: Self-pay | Admitting: Gastroenterology

## 2024-09-07 ENCOUNTER — Encounter: Payer: Self-pay | Admitting: Obstetrics and Gynecology

## 2024-09-07 ENCOUNTER — Other Ambulatory Visit: Payer: Self-pay | Admitting: Obstetrics and Gynecology

## 2024-09-07 DIAGNOSIS — R928 Other abnormal and inconclusive findings on diagnostic imaging of breast: Secondary | ICD-10-CM

## 2024-09-15 ENCOUNTER — Encounter

## 2024-09-15 ENCOUNTER — Other Ambulatory Visit
